# Patient Record
Sex: Male | Born: 1980 | Race: White | Hispanic: No | Marital: Single | Smoking: Never smoker
Health system: Southern US, Community
[De-identification: ages and names within clinical notes are randomized; demographics above are authoritative.]

## PROBLEM LIST (undated history)

## (undated) DIAGNOSIS — J45909 Unspecified asthma, uncomplicated: Secondary | ICD-10-CM

## (undated) DIAGNOSIS — I10 Essential (primary) hypertension: Secondary | ICD-10-CM

## (undated) DIAGNOSIS — E119 Type 2 diabetes mellitus without complications: Secondary | ICD-10-CM

## (undated) DIAGNOSIS — N400 Enlarged prostate without lower urinary tract symptoms: Secondary | ICD-10-CM

## (undated) DIAGNOSIS — K4020 Bilateral inguinal hernia, without obstruction or gangrene, not specified as recurrent: Secondary | ICD-10-CM

## (undated) DIAGNOSIS — B20 Human immunodeficiency virus [HIV] disease: Secondary | ICD-10-CM

## (undated) DIAGNOSIS — E669 Obesity, unspecified: Secondary | ICD-10-CM

## (undated) HISTORY — PX: OTHER SURGICAL HISTORY: SHX169

## (undated) HISTORY — PX: HERNIA REPAIR: SHX51

## (undated) HISTORY — DX: Obesity, unspecified: E66.9

## (undated) HISTORY — DX: Type 2 diabetes mellitus without complications: E11.9

## (undated) HISTORY — DX: Benign prostatic hyperplasia without lower urinary tract symptoms: N40.0

## (undated) HISTORY — DX: Human immunodeficiency virus (HIV) disease: B20

---

## 2010-04-06 ENCOUNTER — Emergency Department (HOSPITAL_COMMUNITY): Admission: EM | Admit: 2010-04-06 | Discharge: 2010-04-06 | Payer: Self-pay | Admitting: Emergency Medicine

## 2010-07-29 ENCOUNTER — Emergency Department (HOSPITAL_COMMUNITY): Admission: EM | Admit: 2010-07-29 | Discharge: 2010-01-11 | Payer: Self-pay | Admitting: Emergency Medicine

## 2010-11-05 LAB — RAPID STREP SCREEN (MED CTR MEBANE ONLY): Streptococcus, Group A Screen (Direct): NEGATIVE

## 2010-11-08 LAB — DIFFERENTIAL
Basophils Absolute: 0 10*3/uL (ref 0.0–0.1)
Basophils Relative: 0 % (ref 0–1)
Eosinophils Absolute: 0.1 10*3/uL (ref 0.0–0.7)
Eosinophils Relative: 1 % (ref 0–5)
Lymphocytes Relative: 25 % (ref 12–46)
Lymphs Abs: 1.3 10*3/uL (ref 0.7–4.0)
Monocytes Absolute: 0.5 10*3/uL (ref 0.1–1.0)
Monocytes Relative: 10 % (ref 3–12)
Neutro Abs: 3.4 10*3/uL (ref 1.7–7.7)
Neutrophils Relative %: 64 % (ref 43–77)

## 2010-11-08 LAB — CBC
HCT: 42 % (ref 39.0–52.0)
Hemoglobin: 14.8 g/dL (ref 13.0–17.0)
MCHC: 35.2 g/dL (ref 30.0–36.0)
MCV: 84.6 fL (ref 78.0–100.0)
Platelets: 245 10*3/uL (ref 150–400)
RBC: 4.97 MIL/uL (ref 4.22–5.81)
RDW: 12.2 % (ref 11.5–15.5)
WBC: 5.4 10*3/uL (ref 4.0–10.5)

## 2010-11-08 LAB — COMPREHENSIVE METABOLIC PANEL
ALT: 94 U/L — ABNORMAL HIGH (ref 0–53)
AST: 41 U/L — ABNORMAL HIGH (ref 0–37)
Albumin: 4 g/dL (ref 3.5–5.2)
Alkaline Phosphatase: 54 U/L (ref 39–117)
BUN: 14 mg/dL (ref 6–23)
CO2: 27 mEq/L (ref 19–32)
Calcium: 9 mg/dL (ref 8.4–10.5)
Chloride: 105 mEq/L (ref 96–112)
Creatinine, Ser: 0.93 mg/dL (ref 0.4–1.5)
GFR calc Af Amer: >60 mL/min (ref >60)
GFR calc non Af Amer: >60 mL/min (ref >60)
Glucose, Bld: 111 mg/dL — ABNORMAL HIGH (ref 70–99)
Potassium: 3.7 mEq/L (ref 3.5–5.1)
Sodium: 139 mEq/L (ref 135–145)
Total Bilirubin: 0.6 mg/dL (ref 0.3–1.2)
Total Protein: 6.7 g/dL (ref 6.0–8.3)

## 2010-11-08 LAB — URINALYSIS, ROUTINE W REFLEX MICROSCOPIC
Bilirubin Urine: NEGATIVE
Glucose, UA: NEGATIVE mg/dL
Hgb urine dipstick: NEGATIVE
Ketones, ur: NEGATIVE mg/dL
Nitrite: NEGATIVE
Protein, ur: NEGATIVE mg/dL
Specific Gravity, Urine: 1.01 (ref 1.005–1.030)
Urobilinogen, UA: 0.2 mg/dL (ref 0.0–1.0)
pH: 6 (ref 5.0–8.0)

## 2010-11-08 LAB — LIPASE, BLOOD: Lipase: 20 U/L (ref 11–59)

## 2011-12-09 ENCOUNTER — Other Ambulatory Visit: Payer: Self-pay | Admitting: Physician Assistant

## 2011-12-09 ENCOUNTER — Ambulatory Visit (HOSPITAL_COMMUNITY)
Admission: RE | Admit: 2011-12-09 | Discharge: 2011-12-09 | Disposition: A | Discharge status: Home / Self Care | Payer: 59 | Source: Ambulatory Visit | Attending: Physician Assistant | Admitting: Physician Assistant

## 2011-12-09 DIAGNOSIS — N400 Enlarged prostate without lower urinary tract symptoms: Secondary | ICD-10-CM | POA: Insufficient documentation

## 2011-12-09 DIAGNOSIS — R1012 Left upper quadrant pain: Secondary | ICD-10-CM

## 2012-01-09 ENCOUNTER — Ambulatory Visit (INDEPENDENT_AMBULATORY_CARE_PROVIDER_SITE_OTHER): Payer: Commercial Managed Care - PPO | Admitting: General Surgery

## 2012-01-09 ENCOUNTER — Encounter (INDEPENDENT_AMBULATORY_CARE_PROVIDER_SITE_OTHER): Payer: Self-pay | Admitting: General Surgery

## 2012-01-09 VITALS — BP 110/80 | HR 78 | Temp 97.9°F | Resp 14 | Ht 68.0 in | Wt 175.4 lb

## 2012-01-09 DIAGNOSIS — K4020 Bilateral inguinal hernia, without obstruction or gangrene, not specified as recurrent: Secondary | ICD-10-CM

## 2012-01-09 NOTE — Progress Notes (Signed)
Patient ID: Mark Maddox, male   DOB: 1980/10/16, 31 y.o.   MRN: [id number]  Chief Complaint  Patient presents with  . New Evaluation    New Pt. Eval [facility]    HPI Mark Maddox is a 31 y.o. male.  Referred by Helene Kelp and Dr. Christell Constant for evaluation of left inguinal hernia.  He says that he works as a Psychologist, sport and exercise at [employer] and felt a "rip" in his groin back in December after lifting a large patient. He subsequently had a coughing spell which caused some discomfort in the area and he has noticed a bulge in both groins which he says has been increasing in size. He says that they reduce spontaneously when lying flat but doesn't complain of some mid abdominal discomfort which occurs about once per year and last for one week at time as well as some occasional bloating. Otherwise he denies any obstructive symptoms.  HPI  History reviewed. No pertinent past medical history.  History reviewed. No pertinent past surgical history.  Family History  Problem Relation Age of Onset  . Diabetes Maternal Grandmother   . Heart disease Maternal Grandfather     heart attack  . Diabetes Maternal Grandfather     Social History History  Substance Use Topics  . Smoking status: Never Smoker   . Smokeless tobacco: Not on file  . Alcohol Use: No    Not on File  No current outpatient prescriptions on file.   No current outpatient prescriptions on file prior to visit.     Review of Systems Review of Systems All other review of systems negative or noncontributory except as stated in the HPI  Blood pressure 110/80, pulse 78, temperature 97.9 F (36.6 C), temperature source Temporal, resp. rate 14, height 5\' 8"  (1.727 m), weight 175 lb 6.4 oz (79.561 kg).  Physical Exam Physical Exam Physical Exam  Vitals reviewed. Constitutional: He is oriented to person, place, and time. He appears well-developed and well-nourished. No distress.  HENT:  Head: Normocephalic and atraumatic.  Mouth/Throat: No  oropharyngeal exudate.  Eyes: Conjunctivae and EOM are normal. Pupils are equal, round, and reactive to light. Right eye exhibits no discharge. Left eye exhibits no discharge. No scleral icterus.  Neck: Normal range of motion. No tracheal deviation present.  Cardiovascular: Normal rate, regular rhythm and normal heart sounds.   Pulmonary/Chest: Effort normal and breath sounds normal. No stridor. No respiratory distress. He has no wheezes. He has no rales. He exhibits no tenderness.  Abdominal: Soft. Bowel sounds are normal. He exhibits no distension and no mass. There is no tenderness. There is no rebound and no guarding. Moderate sized bilateral inguinal bulges which are easily reducible. Musculoskeletal: Normal range of motion. He exhibits no edema and no tenderness.  Neurological: He is alert and oriented to person, place, and time.  Skin: Skin is warm and dry. No rash noted. He is not diaphoretic. No erythema. No pallor.  Psychiatric: He has a normal mood and affect. His behavior is normal. Judgment and thought content normal.    Data Reviewed CT  Assessment    Bilateral inguinal hernias He has mildly symptomatic bilateral inguinal hernias on exam and by history.  I discussed with him the options for continued observation versus surgical repair and both laparoscopic and open repairs. We discussed the pros and cons of each and the risks and benefits and he would like to proceed with laparoscopic bilateral inguinal hernia repair. However, he would like to hold off  on repair until September so we will have him follow up against in July so we can set up his procedure.    Plan    Laparoscopic bilateral inguinal hernia repair with mesh when desired by the patient.       Lodema Pilot DAVID 01/09/2012, 12:19 PM

## 2012-05-17 ENCOUNTER — Other Ambulatory Visit (INDEPENDENT_AMBULATORY_CARE_PROVIDER_SITE_OTHER): Payer: Self-pay | Admitting: General Surgery

## 2012-05-17 DIAGNOSIS — K4020 Bilateral inguinal hernia, without obstruction or gangrene, not specified as recurrent: Secondary | ICD-10-CM

## 2012-06-01 ENCOUNTER — Other Ambulatory Visit (INDEPENDENT_AMBULATORY_CARE_PROVIDER_SITE_OTHER): Payer: Self-pay | Admitting: General Surgery

## 2012-06-05 ENCOUNTER — Ambulatory Visit (INDEPENDENT_AMBULATORY_CARE_PROVIDER_SITE_OTHER): Payer: Commercial Managed Care - PPO | Admitting: General Surgery

## 2012-06-05 ENCOUNTER — Encounter (INDEPENDENT_AMBULATORY_CARE_PROVIDER_SITE_OTHER): Payer: Self-pay | Admitting: General Surgery

## 2012-06-05 VITALS — BP 118/78 | HR 71 | Temp 97.8°F | Resp 16 | Ht 68.0 in | Wt 187.4 lb

## 2012-06-05 DIAGNOSIS — K4020 Bilateral inguinal hernia, without obstruction or gangrene, not specified as recurrent: Secondary | ICD-10-CM

## 2012-06-05 MED ORDER — OXYCODONE-ACETAMINOPHEN 5-325 MG PO TABS: 1.0000 | ORAL_TABLET | ORAL | Status: DC | PRN

## 2012-06-05 NOTE — Progress Notes (Signed)
Patient ID: Mark Maddox, male   DOB: 11-26-1980, 31 y.o.   MRN: [id number]  Chief Complaint  Patient presents with  . Pre-op Exam    Bil ING hernias    HPI Mark Maddox is a 31 y.o. male referred by Dr. Rudi Heap for bilateral hernia fixation. Patient is a 31 year old male with a 8 month history of bilateral inguinal hernia. The an employee at [employer] and noticed a after lifting a heavy patient tenderness and burning sensation is bilingual inguinal areas. The patient as he does pull this bilaterally since this time have increased in size and become more painful. The patient initially wanted to wait for into the beginning of next year but secondary to increasing size and pain wishes to proceed with laparoscopic repair.  The patient denies any urinary or bowel obstructive symptoms at this time. 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 HPI  History reviewed. No pertinent past medical history.  History reviewed. No pertinent past surgical history.  Family History  Problem Relation Age of Onset  . Diabetes Maternal Grandmother   . Heart disease Maternal Grandfather     heart attack  . Diabetes Maternal Grandfather     Social History History  Substance Use Topics  . Smoking status: Never Smoker   . Smokeless tobacco: Not on file  . Alcohol Use: No    No Known Allergies  No current outpatient prescriptions on file.    Review of Systems Review of Systems  Constitutional: Negative.   HENT: Negative.   Eyes: Negative.   Respiratory: Negative.   Cardiovascular: Negative.   Gastrointestinal: Negative.   Musculoskeletal: Negative.   Neurological: Negative.     Blood pressure 118/78, pulse 71, temperature 97.8 F (36.6 C), temperature source Temporal, resp. rate 16, height 5\' 8"  (1.727 m), weight 187 lb 6.4 oz (85.004 kg).  Physical Exam Physical Exam  Constitutional: He is oriented to person, place, and time. He appears  well-developed and well-nourished.  HENT:  Head: Normocephalic and atraumatic.  Eyes: Conjunctivae normal are normal. Pupils are equal, round, and reactive to light.  Neck: Normal range of motion. Neck supple.  Cardiovascular: Normal rate, regular rhythm and normal heart sounds.   Pulmonary/Chest: Effort normal and breath sounds normal.  Abdominal: Soft. Bowel sounds are normal. A hernia is present. Hernia confirmed positive in the right inguinal area and confirmed positive in the left inguinal area.  Musculoskeletal: Normal range of motion.  Neurological: He is alert and oriented to person, place, and time.     Assessment    31 year old man with bilateral in her hernias are reducible.    Plan    1. Proceed operative for a laparoscopic bilateral inguinal hernia repair with Mesh.  2. All risks and benefits were discussed with the patient, to generally include infection, bleeding, damage to surrounding structures, and recurrence. Alternatives were offered and described.  All questions were answered and the patient voiced understanding of the procedure and wishes to proceed at this point.        Marigene Ehlers., Mikeria Valin 06/05/2012, 3:02 PM

## 2012-06-26 ENCOUNTER — Encounter (HOSPITAL_COMMUNITY)
Admission: RE | Admit: 2012-06-26 | Discharge: 2012-06-26 | Disposition: A | Discharge status: Home / Self Care | Payer: 59 | Source: Ambulatory Visit | Attending: Anesthesiology | Admitting: Anesthesiology

## 2012-06-26 ENCOUNTER — Encounter (HOSPITAL_COMMUNITY)
Admission: RE | Admit: 2012-06-26 | Discharge: 2012-06-26 | Disposition: A | Discharge status: Home / Self Care | Payer: 59 | Source: Ambulatory Visit | Attending: General Surgery | Admitting: General Surgery

## 2012-06-26 ENCOUNTER — Encounter (HOSPITAL_COMMUNITY): Payer: Self-pay

## 2012-06-26 HISTORY — DX: Unspecified asthma, uncomplicated: J45.909

## 2012-06-26 HISTORY — DX: Essential (primary) hypertension: I10

## 2012-06-26 HISTORY — DX: Bilateral inguinal hernia, without obstruction or gangrene, not specified as recurrent: K40.20

## 2012-06-26 LAB — BASIC METABOLIC PANEL
BUN: 11 mg/dL (ref 6–23)
CO2: 29 mEq/L (ref 19–32)
Calcium: 9.8 mg/dL (ref 8.4–10.5)
Chloride: 100 mEq/L (ref 96–112)
Creatinine, Ser: 0.88 mg/dL (ref 0.50–1.35)
GFR calc Af Amer: >90 mL/min (ref >90)
GFR calc non Af Amer: >90 mL/min (ref >90)
Glucose, Bld: 121 mg/dL — ABNORMAL HIGH (ref 70–99)
Potassium: 3.7 mEq/L (ref 3.5–5.1)
Sodium: 138 mEq/L (ref 135–145)

## 2012-06-26 LAB — SURGICAL PCR SCREEN
MRSA, PCR: NEGATIVE
Staphylococcus aureus: POSITIVE — AB

## 2012-06-26 LAB — CBC
HCT: 47.2 % (ref 39.0–52.0)
Hemoglobin: 16.3 g/dL (ref 13.0–17.0)
MCH: 29.2 pg (ref 26.0–34.0)
MCHC: 34.5 g/dL (ref 30.0–36.0)
MCV: 84.6 fL (ref 78.0–100.0)
Platelets: 223 10*3/uL (ref 150–400)
RBC: 5.58 MIL/uL (ref 4.22–5.81)
RDW: 12.3 % (ref 11.5–15.5)
WBC: 6.4 10*3/uL (ref 4.0–10.5)

## 2012-06-26 NOTE — Pre-Procedure Instructions (Signed)
20 DEMICHAEL TRAUM  06/26/2012   Your procedure is scheduled on:  Wednesday July 04, 2012  Report to [facility] at 7:30 AM.  Call this number if you have problems the morning of surgery: [phone]   Remember:   Do not eat food or drink :After Midnight.      Take these medicines the morning of surgery with A SIP OF WATER: none   Do not wear jewelry, make-up or nail polish.  Do not wear lotions, powders, or perfumes.   Do not shave 48 hours prior to surgery. Men may shave face and neck.  Do not bring valuables to the hospital.  Contacts, dentures or bridgework may not be worn into surgery.  Leave suitcase in the car. After surgery it may be brought to your room.  For patients admitted to the hospital, checkout time is 11:00 AM the day of discharge.   Patients discharged the day of surgery will not be allowed to drive home.  Name and phone number of your driver: family / friend  Special Instructions: Shower using CHG 2 nights before surgery and the night before surgery.  If you shower the day of surgery use CHG.  Use special wash - you have one bottle of CHG for all showers.  You should use approximately 1/3 of the bottle for each shower.   Please read over the following fact sheets that you were given: Pain Booklet, Coughing and Deep Breathing, MRSA Information and Surgical Site Infection Prevention

## 2012-07-03 MED ORDER — CEFAZOLIN SODIUM-DEXTROSE 2-3 GM-% IV SOLR
2.0000 g | INTRAVENOUS | Status: AC
  Administered 2012-07-04: 2 g via INTRAVENOUS
  Filled 2012-07-03: qty 50

## 2012-07-04 ENCOUNTER — Encounter (HOSPITAL_COMMUNITY): Payer: Self-pay | Admitting: *Deleted

## 2012-07-04 ENCOUNTER — Ambulatory Visit (HOSPITAL_COMMUNITY): Payer: 59 | Admitting: Anesthesiology

## 2012-07-04 ENCOUNTER — Encounter (HOSPITAL_COMMUNITY): Payer: Self-pay | Admitting: Anesthesiology

## 2012-07-04 ENCOUNTER — Ambulatory Visit (HOSPITAL_COMMUNITY)
Admission: RE | Admit: 2012-07-04 | Discharge: 2012-07-04 | Disposition: A | Discharge status: Home / Self Care | Payer: 59 | Source: Ambulatory Visit | Attending: General Surgery | Admitting: General Surgery

## 2012-07-04 ENCOUNTER — Encounter (HOSPITAL_COMMUNITY)
Admission: RE | Disposition: A | Discharge status: Home / Self Care | Payer: Self-pay | Source: Ambulatory Visit | Attending: General Surgery

## 2012-07-04 DIAGNOSIS — K4020 Bilateral inguinal hernia, without obstruction or gangrene, not specified as recurrent: Secondary | ICD-10-CM | POA: Insufficient documentation

## 2012-07-04 DIAGNOSIS — Z8719 Personal history of other diseases of the digestive system: Secondary | ICD-10-CM

## 2012-07-04 DIAGNOSIS — Z01818 Encounter for other preprocedural examination: Secondary | ICD-10-CM | POA: Insufficient documentation

## 2012-07-04 DIAGNOSIS — I10 Essential (primary) hypertension: Secondary | ICD-10-CM | POA: Insufficient documentation

## 2012-07-04 DIAGNOSIS — Z01812 Encounter for preprocedural laboratory examination: Secondary | ICD-10-CM | POA: Insufficient documentation

## 2012-07-04 DIAGNOSIS — Z9889 Other specified postprocedural states: Secondary | ICD-10-CM

## 2012-07-04 DIAGNOSIS — Z01810 Encounter for preprocedural cardiovascular examination: Secondary | ICD-10-CM | POA: Insufficient documentation

## 2012-07-04 HISTORY — PX: INSERTION OF MESH: SHX5868

## 2012-07-04 HISTORY — PX: INGUINAL HERNIA REPAIR: SHX194

## 2012-07-04 SURGERY — REPAIR, HERNIA, INGUINAL, BILATERAL, LAPAROSCOPIC
Anesthesia: General | Site: Abdomen | Laterality: Bilateral | Wound class: Clean

## 2012-07-04 MED ORDER — OXYCODONE HCL 5 MG/5ML PO SOLN: 5.0000 mg | Freq: Once | ORAL | Status: DC | PRN

## 2012-07-04 MED ORDER — DEXAMETHASONE SODIUM PHOSPHATE 4 MG/ML IJ SOLN
INTRAMUSCULAR | Status: DC | PRN
  Administered 2012-07-04: 4 mg via INTRAVENOUS

## 2012-07-04 MED ORDER — MIDAZOLAM HCL 2 MG/2ML IJ SOLN
INTRAMUSCULAR | Status: AC
  Filled 2012-07-04: qty 2

## 2012-07-04 MED ORDER — FENTANYL CITRATE 0.05 MG/ML IJ SOLN
INTRAMUSCULAR | Status: DC | PRN
  Administered 2012-07-04: 100 ug via INTRAVENOUS
  Administered 2012-07-04 (×2): 50 ug via INTRAVENOUS

## 2012-07-04 MED ORDER — HYDROMORPHONE HCL PF 1 MG/ML IJ SOLN
0.2500 mg | INTRAMUSCULAR | Status: DC | PRN
  Administered 2012-07-04 (×3): 0.5 mg via INTRAVENOUS

## 2012-07-04 MED ORDER — MIDAZOLAM HCL 2 MG/2ML IJ SOLN
1.0000 mg | INTRAMUSCULAR | Status: DC | PRN
  Administered 2012-07-04: 2 mg via INTRAVENOUS

## 2012-07-04 MED ORDER — GLYCOPYRROLATE 0.2 MG/ML IJ SOLN
INTRAMUSCULAR | Status: DC | PRN
  Administered 2012-07-04: .5 mg via INTRAVENOUS

## 2012-07-04 MED ORDER — HYDROMORPHONE HCL PF 1 MG/ML IJ SOLN
INTRAMUSCULAR | Status: AC
  Filled 2012-07-04: qty 1

## 2012-07-04 MED ORDER — PROMETHAZINE HCL 25 MG/ML IJ SOLN: 6.2500 mg | INTRAMUSCULAR | Status: DC | PRN

## 2012-07-04 MED ORDER — ARTIFICIAL TEARS OP OINT
TOPICAL_OINTMENT | OPHTHALMIC | Status: DC | PRN
  Administered 2012-07-04: 1 via OPHTHALMIC

## 2012-07-04 MED ORDER — 0.9 % SODIUM CHLORIDE (POUR BTL) OPTIME
TOPICAL | Status: DC | PRN
  Administered 2012-07-04: 1000 mL

## 2012-07-04 MED ORDER — LACTATED RINGERS IV SOLN
INTRAVENOUS | Status: DC | PRN
  Administered 2012-07-04 (×2): via INTRAVENOUS

## 2012-07-04 MED ORDER — MEPERIDINE HCL 25 MG/ML IJ SOLN: 6.2500 mg | INTRAMUSCULAR | Status: DC | PRN

## 2012-07-04 MED ORDER — OXYCODONE HCL 5 MG PO TABS: 5.0000 mg | ORAL_TABLET | Freq: Once | ORAL | Status: DC | PRN

## 2012-07-04 MED ORDER — LACTATED RINGERS IV SOLN: INTRAVENOUS | Status: DC

## 2012-07-04 MED ORDER — NEOSTIGMINE METHYLSULFATE 1 MG/ML IJ SOLN
INTRAMUSCULAR | Status: DC | PRN
  Administered 2012-07-04: 4 mg via INTRAVENOUS

## 2012-07-04 MED ORDER — DEXTROSE 5 % IV SOLN
INTRAVENOUS | Status: DC | PRN
  Administered 2012-07-04: 10:00:00 via INTRAVENOUS

## 2012-07-04 MED ORDER — ROCURONIUM BROMIDE 100 MG/10ML IV SOLN
INTRAVENOUS | Status: DC | PRN
  Administered 2012-07-04: 50 mg via INTRAVENOUS
  Administered 2012-07-04: 20 mg via INTRAVENOUS

## 2012-07-04 MED ORDER — HYDROMORPHONE HCL PF 1 MG/ML IJ SOLN: 0.2500 mg | INTRAMUSCULAR | Status: DC | PRN

## 2012-07-04 MED ORDER — LIDOCAINE HCL (CARDIAC) 20 MG/ML IV SOLN
INTRAVENOUS | Status: DC | PRN
  Administered 2012-07-04: 100 mg via INTRAVENOUS

## 2012-07-04 MED ORDER — BUPIVACAINE HCL (PF) 0.25 % IJ SOLN
INTRAMUSCULAR | Status: AC
  Filled 2012-07-04: qty 30

## 2012-07-04 MED ORDER — ONDANSETRON HCL 4 MG/2ML IJ SOLN
INTRAMUSCULAR | Status: DC | PRN
  Administered 2012-07-04: 4 mg via INTRAVENOUS

## 2012-07-04 MED ORDER — PROPOFOL 10 MG/ML IV BOLUS
INTRAVENOUS | Status: DC | PRN
  Administered 2012-07-04: 50 mg via INTRAVENOUS
  Administered 2012-07-04: 200 mg via INTRAVENOUS

## 2012-07-04 MED ORDER — BUPIVACAINE HCL 0.25 % IJ SOLN
INTRAMUSCULAR | Status: DC | PRN
  Administered 2012-07-04: 8 mL

## 2012-07-04 MED ORDER — OXYCODONE-ACETAMINOPHEN 5-325 MG PO TABS: 1.0000 | ORAL_TABLET | ORAL | Status: DC | PRN

## 2012-07-04 SURGICAL SUPPLY — 48 items
APPLIER CLIP LOGIC TI 5 (MISCELLANEOUS) IMPLANT
BENZOIN TINCTURE PRP APPL 2/3 (GAUZE/BANDAGES/DRESSINGS) ×2 IMPLANT
CANISTER SUCTION 2500CC (MISCELLANEOUS) IMPLANT
CHLORAPREP W/TINT 26ML (MISCELLANEOUS) ×2 IMPLANT
CLOTH BEACON ORANGE TIMEOUT ST (SAFETY) ×2 IMPLANT
COVER SURGICAL LIGHT HANDLE (MISCELLANEOUS) ×2 IMPLANT
DEVICE TROCAR PUNCTURE CLOSURE (ENDOMECHANICALS) ×2 IMPLANT
DISSECTOR BLUNT TIP ENDO 5MM (MISCELLANEOUS) IMPLANT
DRAPE UTILITY 15X26 W/TAPE STR (DRAPE) ×4 IMPLANT
ELECT REM PT RETURN 9FT ADLT (ELECTROSURGICAL) ×2
ELECTRODE REM PT RTRN 9FT ADLT (ELECTROSURGICAL) ×1 IMPLANT
GAUZE SPONGE 2X2 8PLY STRL LF (GAUZE/BANDAGES/DRESSINGS) ×1 IMPLANT
GLOVE BIO SURGEON STRL SZ7 (GLOVE) ×2 IMPLANT
GLOVE BIO SURGEON STRL SZ7.5 (GLOVE) ×4 IMPLANT
GLOVE BIOGEL PI IND STRL 6.5 (GLOVE) ×1 IMPLANT
GLOVE BIOGEL PI IND STRL 7.0 (GLOVE) ×4 IMPLANT
GLOVE BIOGEL PI IND STRL 7.5 (GLOVE) ×1 IMPLANT
GLOVE BIOGEL PI INDICATOR 6.5 (GLOVE) ×1
GLOVE BIOGEL PI INDICATOR 7.0 (GLOVE) ×4
GLOVE BIOGEL PI INDICATOR 7.5 (GLOVE) ×1
GLOVE SS BIOGEL STRL SZ 6.5 (GLOVE) ×1 IMPLANT
GLOVE SUPERSENSE BIOGEL SZ 6.5 (GLOVE) ×1
GLOVE SURG SS PI 7.0 STRL IVOR (GLOVE) ×2 IMPLANT
GOWN STRL NON-REIN LRG LVL3 (GOWN DISPOSABLE) ×4 IMPLANT
GOWN STRL REIN XL XLG (GOWN DISPOSABLE) ×2 IMPLANT
KIT BASIN OR (CUSTOM PROCEDURE TRAY) ×2 IMPLANT
KIT ROOM TURNOVER OR (KITS) ×2 IMPLANT
MESH ULTRAPRO 6X6 15CM15CM (Mesh General) ×4 IMPLANT
NEEDLE INSUFFLATION 14GA 120MM (NEEDLE) ×2 IMPLANT
NS IRRIG 1000ML POUR BTL (IV SOLUTION) ×2 IMPLANT
PAD ARMBOARD 7.5X6 YLW CONV (MISCELLANEOUS) ×4 IMPLANT
RELOAD STAPLE HERNIA 4.0 BLUE (INSTRUMENTS) ×2 IMPLANT
RELOAD STAPLE HERNIA 4.8 BLK (STAPLE) IMPLANT
SCISSORS LAP 5X35 DISP (ENDOMECHANICALS) ×2 IMPLANT
SET IRRIG TUBING LAPAROSCOPIC (IRRIGATION / IRRIGATOR) IMPLANT
SLEEVE ENDOPATH XCEL 5M (ENDOMECHANICALS) ×2 IMPLANT
SPONGE GAUZE 2X2 STER 10/PKG (GAUZE/BANDAGES/DRESSINGS) ×1
STAPLER HERNIA 12 8.5 360D (INSTRUMENTS) ×2 IMPLANT
SUT MNCRL AB 4-0 PS2 18 (SUTURE) ×2 IMPLANT
SUT VIC AB 1 CT1 27 (SUTURE) ×1
SUT VIC AB 1 CT1 27XBRD ANBCTR (SUTURE) ×1 IMPLANT
TOWEL OR 17X24 6PK STRL BLUE (TOWEL DISPOSABLE) ×2 IMPLANT
TOWEL OR 17X26 10 PK STRL BLUE (TOWEL DISPOSABLE) ×2 IMPLANT
TRAY FOLEY CATH 14FR (SET/KITS/TRAYS/PACK) ×2 IMPLANT
TRAY LAPAROSCOPIC (CUSTOM PROCEDURE TRAY) ×2 IMPLANT
TROCAR XCEL 12X100 BLDLESS (ENDOMECHANICALS) ×2 IMPLANT
TROCAR XCEL NON-BLD 11X100MML (ENDOMECHANICALS) ×2 IMPLANT
TROCAR XCEL NON-BLD 5MMX100MML (ENDOMECHANICALS) ×2 IMPLANT

## 2012-07-04 NOTE — Anesthesia Preprocedure Evaluation (Addendum)
Anesthesia Evaluation  Patient identified by MRN, date of birth, ID band Patient awake    Reviewed: Allergy & Precautions, H&P , NPO status , Patient's Chart, lab work & pertinent test results  Airway  TM Distance: >3 FB     Dental  (+) Teeth Intact and Dental Advisory Given   Pulmonary   Findings: Lungs clear.  Heart size and pulmonary vascularity are normal.  No adenopathy.  There is slight upper thoracic levoscoliosis.   IMPRESSION: Lungs clear.  Childhood asthma/ parents were smokers.  No probs. now  breath sounds clear to auscultation  Pulmonary exam normal       Cardiovascular Exercise Tolerance: Good hypertension, Rhythm:Regular Rate:Normal  26-Jun-2012 09:54:00 [facility] ROUTINE RECORD Normal sinus rhythm Normal ECG No old tracing to compare   Neuro/Psych negative neurological ROS  negative psych ROS   GI/Hepatic negative GI ROS, Neg liver ROS,   Endo/Other  negative endocrine ROS  Renal/GU negative Renal ROS     Musculoskeletal negative musculoskeletal ROS (+)   Abdominal   Peds  Hematology negative hematology ROS (+)   Anesthesia Other Findings   Reproductive/Obstetrics                       Anesthesia Physical Anesthesia Plan  ASA: I  Anesthesia Plan: General   Post-op Pain Management:    Induction: Intravenous  Airway Management Planned: Oral ETT  Additional Equipment:   Intra-op Plan:   Post-operative Plan: Extubation in OR  Informed Consent: I have reviewed the patients History and Physical, chart, labs and discussed the procedure including the risks, benefits and alternatives for the proposed anesthesia with the patient or authorized representative who has indicated his/her understanding and acceptance.   Dental advisory given  Plan Discussed with: CRNA  Anesthesia Plan Comments:         Anesthesia Quick Evaluation

## 2012-07-04 NOTE — Interval H&P Note (Signed)
History and Physical Interval Note:  07/04/2012 9:15 AM  Rayburn Felt  has presented today for surgery, with the diagnosis of BILATERAL INGUINAL HERNIAS  The various methods of treatment have been discussed with the patient and family. After consideration of risks, benefits and other options for treatment, the patient has consented to  Procedure(s) (LRB) with comments: LAPAROSCOPIC BILATERAL INGUINAL HERNIA REPAIR (Bilateral) INSERTION OF MESH (Bilateral) as a surgical intervention .  The patient's history has been reviewed, patient examined, no change in status, stable for surgery.  I have reviewed the patient's chart and labs.  Questions were answered to the patient's satisfaction.     Marigene Ehlers., Jed Limerick

## 2012-07-04 NOTE — Op Note (Signed)
Pre Operative Diagnosis: bilateral inguinal hernias  Post Operative Diagnosis: same  Procedure: laparoscopic bilateral inguinal hernia repair with mesh  Surgeon: Dr. Axel Filler  Assistant: none  Anesthesia: GETA  EBL: 10 cc  Complications: none  Counts: reported as correct x 2  Findings:  The patient had a bilateral direct hernias  Indications for procedure:  The patient is a 31 year old male with bilateral direct hernias. Patient stated the pain became more severe over the last several months and decided to have this electively repaired.  Details of the procedure:The patient was taken back to the operating room. The patient was placed in supine position with bilateral SCDs in place. After appropriate anitbiotics were confirmed, a time-out was confirmed and all facts were verified.  0.25% Marcaine was used to infiltrate the umbilical area. He was used to cut down the skin and blunt dissection was used to get the anterior fashion.  The anterior fascia was incised approximately 1 cm and the muscles were divided anteriorly. Blunt dissection was then used to create a space in the preperitoneal area. At this time a 10 mm camera was then introduced into the space and advanced the pubic tubercle and a 12 mm trocar was placed over this and insufflation was started.  At this time and space was created from medial to laterally the preperitoneal space. The hernia sac was identified. Dissection of the hernia sac was undertaken the vas deferens was identified and protected in all parts of the case.   A Veress needle right upper quadrant to help evacuate the intraperitoneal air for a likely peritoneal tear.  Once the hernia sac was taken down to approximately the umbilicus. A 6 x 6" Prolite mesh was cut into shape and introduced into the preperitoneal space.  The mesh was brought over the hernia defect and anchored into place and secured to Cooper's ligament with 4.0 there was a hernia stapler staples.  It was anchored to the anterior abdominal wall with 4.8 mm staples. The lipoma was seen over the mesh. There was no staples placed laterally.   The exact same procedure took place on the left side.  The insufflation was evacuated. The trochars were removed. The anterior fascia was reapproximated using #1 Vicryl on a UR- 6.  Intra-abdominal air was evacuated the Veress needle. The skin was reapproximated using 4-0 Monocryl subcuticular fashion the patient was awakened from general anesthesia and taken to recovery in stable condition.

## 2012-07-04 NOTE — Anesthesia Procedure Notes (Signed)
Procedure Name: Intubation Date/Time: 07/04/2012 9:34 AM Performed by: Tyrone Nine Pre-anesthesia Checklist: Emergency Drugs available, Suction available, Patient being monitored, Patient identified and Timeout performed Patient Re-evaluated:Patient Re-evaluated prior to inductionOxygen Delivery Method: Circle system utilized Preoxygenation: Pre-oxygenation with 100% oxygen Intubation Type: IV induction Ventilation: Mask ventilation without difficulty Laryngoscope Size: Mac and 3 Grade View: Grade I Tube type: Oral Tube size: 7.5 mm Number of attempts: 1 Airway Equipment and Method: Stylet Placement Confirmation: breath sounds checked- equal and bilateral,  CO2 detector,  positive ETCO2 and ETT inserted through vocal cords under direct vision Secured at: 0 cm Tube secured with: Tape Dental Injury: Teeth and Oropharynx as per pre-operative assessment

## 2012-07-04 NOTE — Anesthesia Postprocedure Evaluation (Signed)
Anesthesia Post Note  Patient: Mark Maddox  Procedure(s) Performed: Procedure(s) (LRB): LAPAROSCOPIC BILATERAL INGUINAL HERNIA REPAIR (Bilateral) INSERTION OF MESH (Bilateral)  Anesthesia type: General  Patient location: PACU  Post pain: Pain level controlled and Adequate analgesia  Post assessment: Post-op Vital signs reviewed, Patient's Cardiovascular Status Stable, Respiratory Function Stable, Patent Airway and Pain level controlled  Last Vitals:  Filed Vitals:   07/04/12 1215  BP:   Pulse:   Temp: 36.7 C  Resp:     Post vital signs: Reviewed and stable  Level of consciousness: awake, alert  and oriented  Complications: No apparent anesthesia complications

## 2012-07-04 NOTE — Transfer of Care (Signed)
Immediate Anesthesia Transfer of Care Note  Patient: Mark Maddox  Procedure(s) Performed: Procedure(s) (LRB) with comments: LAPAROSCOPIC BILATERAL INGUINAL HERNIA REPAIR (Bilateral) INSERTION OF MESH (Bilateral)  Patient Location: PACU  Anesthesia Type:General  Level of Consciousness: sedated and patient cooperative  Airway & Oxygen Therapy: Patient Spontanous Breathing and Patient connected to face mask oxygen  Post-op Assessment: Report given to PACU RN and Post -op Vital signs reviewed and stable  Post vital signs: Reviewed and stable  Complications: No apparent anesthesia complications

## 2012-07-04 NOTE — H&P (View-Only) (Signed)
Patient ID: Mark Maddox, male   DOB: 10/26/1980, 31 y.o.   MRN: [id number]  Chief Complaint  Patient presents with  . Pre-op Exam    Bil ING hernias    HPI Mark Maddox is a 31 y.o. male referred by Dr. Donald Moore for bilateral hernia fixation. Patient is a 31-year-old male with a 8 month history of bilateral inguinal hernia. The an employee at [employer] and noticed a after lifting a heavy patient tenderness and burning sensation is bilingual inguinal areas. The patient as he does pull this bilaterally since this time have increased in size and become more painful. The patient initially wanted to wait for into the beginning of next year but secondary to increasing size and pain wishes to proceed with laparoscopic repair.  The patient denies any urinary or bowel obstructive symptoms at this time. 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 HPI  History reviewed. No pertinent past medical history.  History reviewed. No pertinent past surgical history.  Family History  Problem Relation Age of Onset  . Diabetes Maternal Grandmother   . Heart disease Maternal Grandfather     heart attack  . Diabetes Maternal Grandfather     Social History History  Substance Use Topics  . Smoking status: Never Smoker   . Smokeless tobacco: Not on file  . Alcohol Use: No    No Known Allergies  No current outpatient prescriptions on file.    Review of Systems Review of Systems  Constitutional: Negative.   HENT: Negative.   Eyes: Negative.   Respiratory: Negative.   Cardiovascular: Negative.   Gastrointestinal: Negative.   Musculoskeletal: Negative.   Neurological: Negative.     Blood pressure 118/78, pulse 71, temperature 97.8 F (36.6 C), temperature source Temporal, resp. rate 16, height 5' 8" (1.727 m), weight 187 lb 6.4 oz (85.004 kg).  Physical Exam Physical Exam  Constitutional: He is oriented to person, place, and time. He appears  well-developed and well-nourished.  HENT:  Head: Normocephalic and atraumatic.  Eyes: Conjunctivae normal are normal. Pupils are equal, round, and reactive to light.  Neck: Normal range of motion. Neck supple.  Cardiovascular: Normal rate, regular rhythm and normal heart sounds.   Pulmonary/Chest: Effort normal and breath sounds normal.  Abdominal: Soft. Bowel sounds are normal. A hernia is present. Hernia confirmed positive in the right inguinal area and confirmed positive in the left inguinal area.  Musculoskeletal: Normal range of motion.  Neurological: He is alert and oriented to person, place, and time.     Assessment    31-year-old man with bilateral in her hernias are reducible.    Plan    1. Proceed operative for a laparoscopic bilateral inguinal hernia repair with Mesh.  2. All risks and benefits were discussed with the patient, to generally include infection, bleeding, damage to surrounding structures, and recurrence. Alternatives were offered and described.  All questions were answered and the patient voiced understanding of the procedure and wishes to proceed at this point.        Mousa Prout Jr., Jeanetta Alonzo 06/05/2012, 3:02 PM    

## 2012-07-04 NOTE — Preoperative (Signed)
Beta Blockers   Reason not to administer Beta Blockers:Not Applicable 

## 2012-07-09 ENCOUNTER — Encounter (HOSPITAL_COMMUNITY): Payer: Self-pay | Admitting: General Surgery

## 2012-07-12 ENCOUNTER — Telehealth (INDEPENDENT_AMBULATORY_CARE_PROVIDER_SITE_OTHER): Payer: Self-pay | Admitting: General Surgery

## 2012-07-12 NOTE — Telephone Encounter (Signed)
Patient called in requesting additional pain medication had bilateral hernia repair w/mesh on 07/04/12. Patient said he has 10 pills left and did not want to run out over the weekend. I advised the patient his request would be sent to Dr. Derrell Lolling and his nurse to advise and he will be contacted if the refill has been approved. Patient agreed and stated he has been having pain because he has been coughing a lot due to his sinuses and he has pinkeye.

## 2012-07-17 ENCOUNTER — Ambulatory Visit (INDEPENDENT_AMBULATORY_CARE_PROVIDER_SITE_OTHER): Payer: Commercial Managed Care - PPO | Admitting: General Surgery

## 2012-07-17 ENCOUNTER — Encounter (INDEPENDENT_AMBULATORY_CARE_PROVIDER_SITE_OTHER): Payer: Self-pay | Admitting: General Surgery

## 2012-07-17 VITALS — BP 112/64 | HR 85 | Temp 97.7°F | Resp 16 | Ht 68.0 in | Wt 189.6 lb

## 2012-07-17 DIAGNOSIS — Z8719 Personal history of other diseases of the digestive system: Secondary | ICD-10-CM

## 2012-07-17 DIAGNOSIS — Z9889 Other specified postprocedural states: Secondary | ICD-10-CM

## 2012-07-17 NOTE — Progress Notes (Signed)
Patient ID: Mark Maddox, male   DOB: [birth year], 31 y.o.   MRN: [id number] The patient is a 31 year old male status post bilateral inguinal hernia repair was done laparoscopically. Patient is to do well postoperatively and as expected. Patient has no pain in his inguinal areas but does have some lower abdominal pain that is consistent with postoperative pain. Patient has otherwise been having normal bowel function.  On exam: Wounds are clean,dry, and intact, there is no palpable hernia on either side.   Assessment plan: We will have the patient followup in one month 2 the patient had a concern he can call further followup.

## 2012-08-09 ENCOUNTER — Encounter (INDEPENDENT_AMBULATORY_CARE_PROVIDER_SITE_OTHER): Payer: Commercial Managed Care - PPO | Admitting: General Surgery

## 2013-05-14 IMAGING — CT CT ABD-PELV W/O CM
2 of 4 series · 17 of 46 positions shown, 19 images · non-contrast
Comparison: 01/11/2010

CLINICAL DATA: Left side abdominal pain for 2 days

CT ABDOMEN AND PELVIS WITHOUT CONTRAST
TECHNIQUE: Multidetector CT imaging of the abdomen and pelvis was
performed following the standard protocol without intravenous
contrast. Oral contrast not administered.  Sagittal and coronal MPR
images reconstructed from axial data set.

[Series 2: abdomen/pelvis w/o contrast · axial · non-contrast · 0.78mm/px · z∈[-505,-85]mm · 14 of 98 slices shown, 16 images]
[im 7/98  soft-tissue]
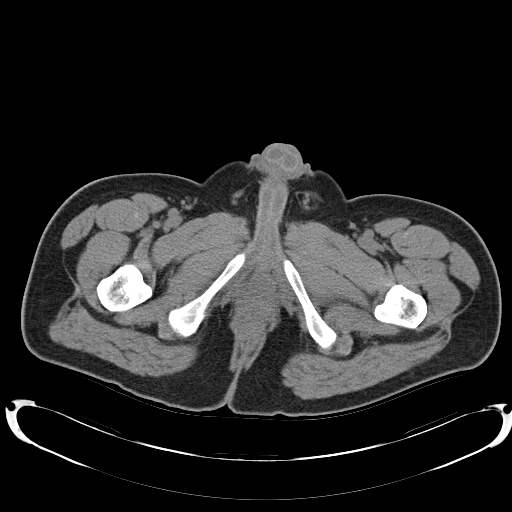
[im 7/98  bone]
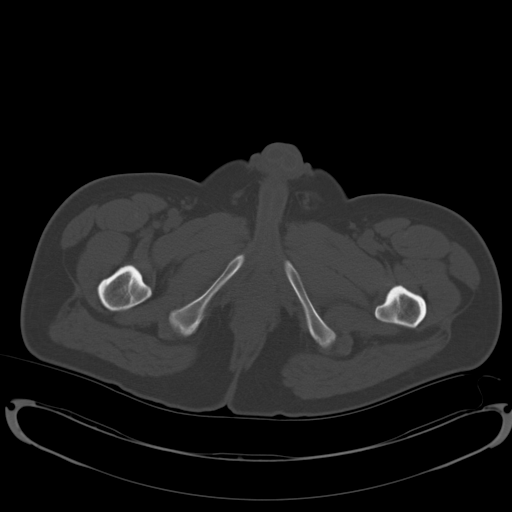
[im 13/98  soft-tissue]
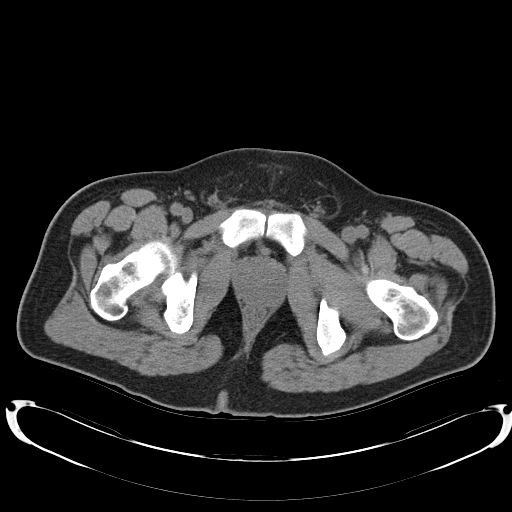
[im 20/98  soft-tissue]
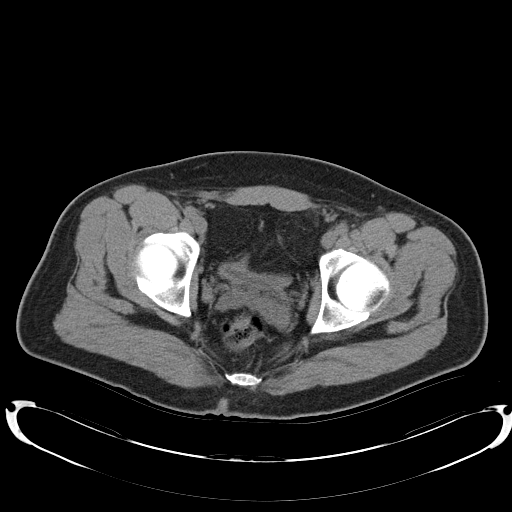
[im 26/98  soft-tissue]
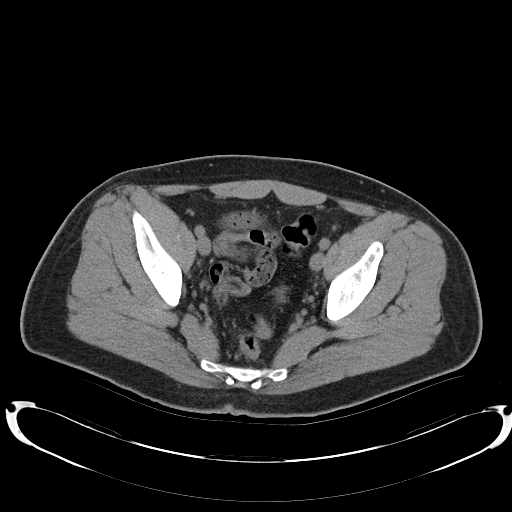
[im 33/98  soft-tissue]
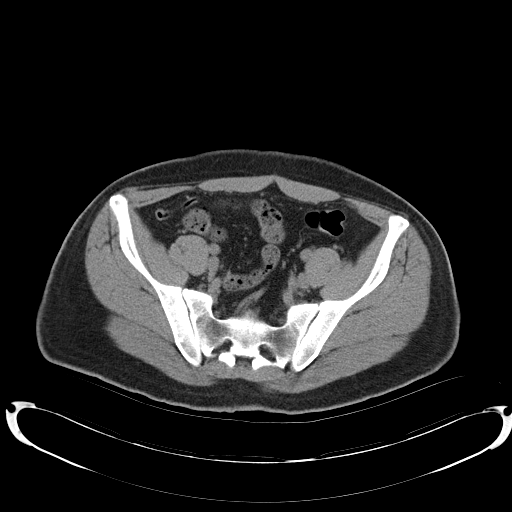
[im 39/98  soft-tissue]
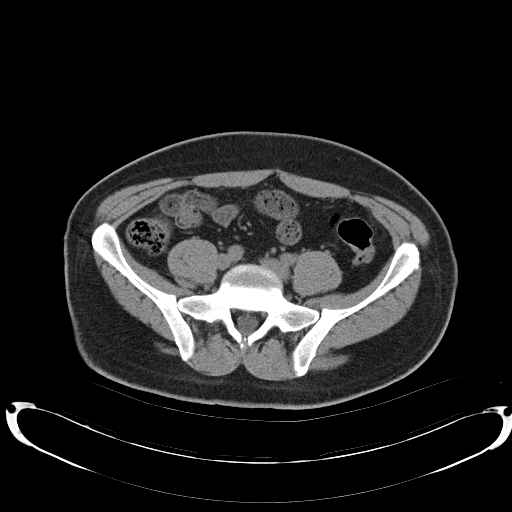
[im 46/98  soft-tissue]
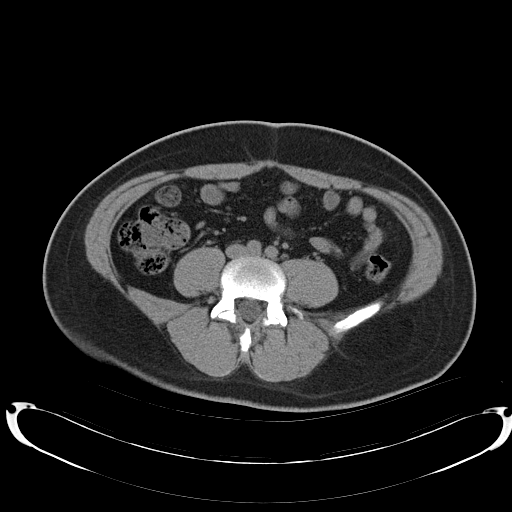
[im 52/98  soft-tissue]
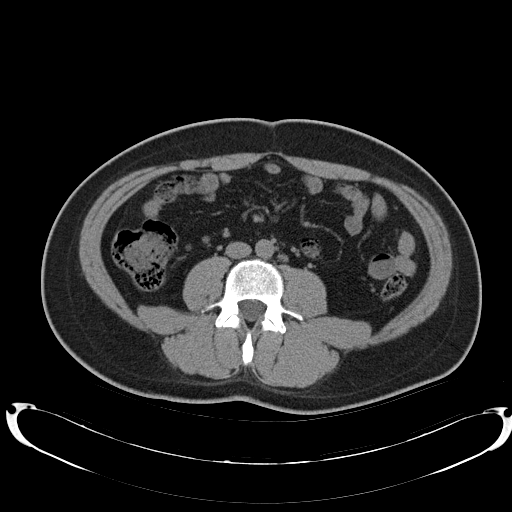
[im 59/98  soft-tissue]
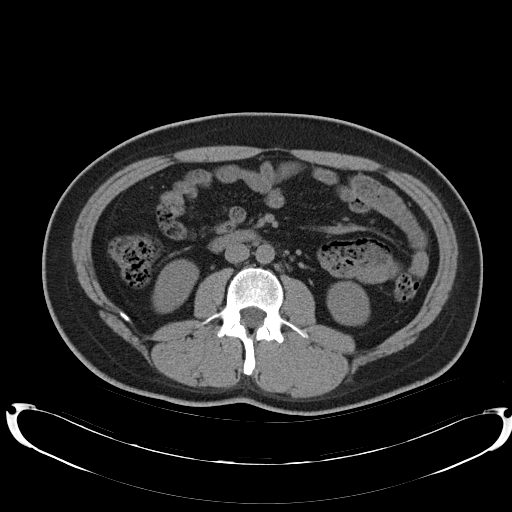
[im 59/98  bone]
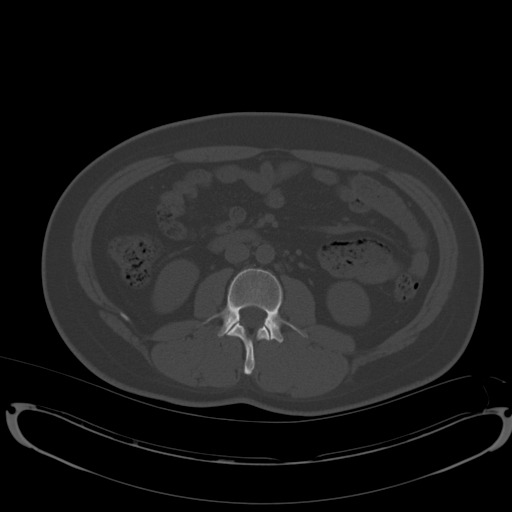
[im 65/98  soft-tissue]
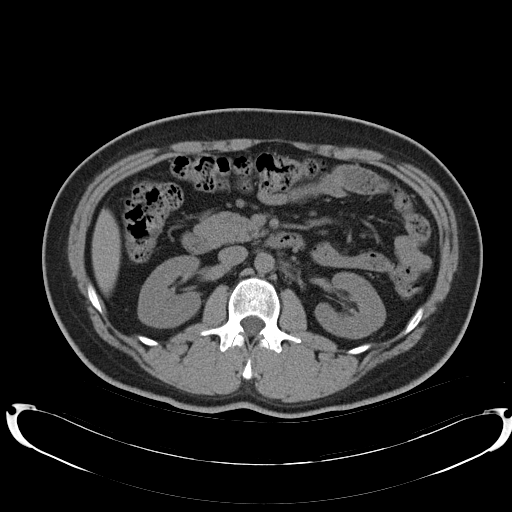
[im 72/98  soft-tissue]
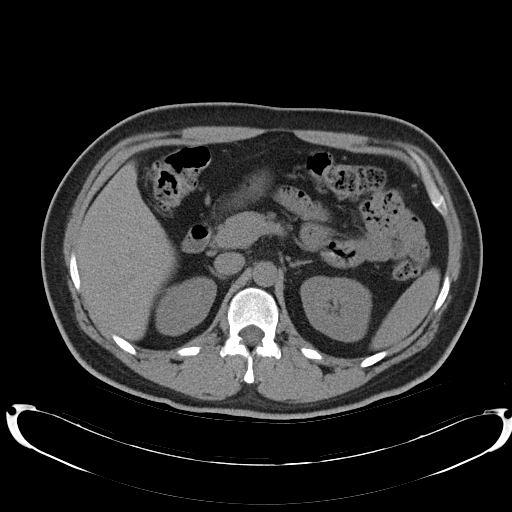
[im 78/98  soft-tissue]
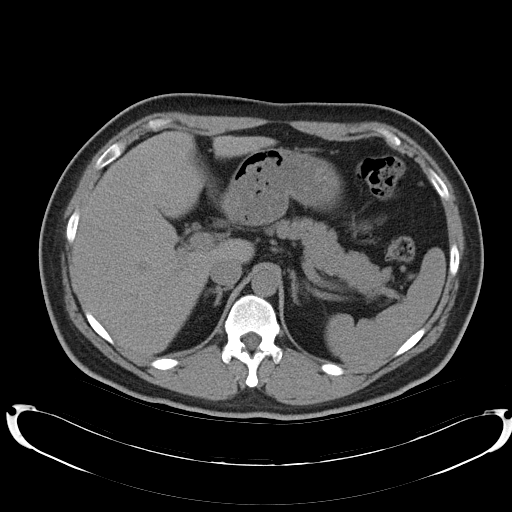
[im 85/98  soft-tissue]
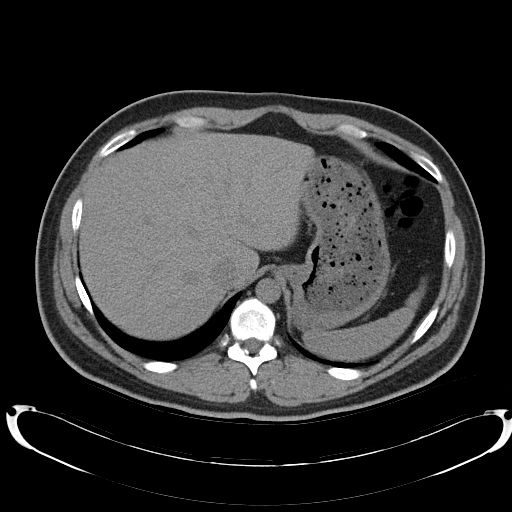
[im 91/98  soft-tissue]
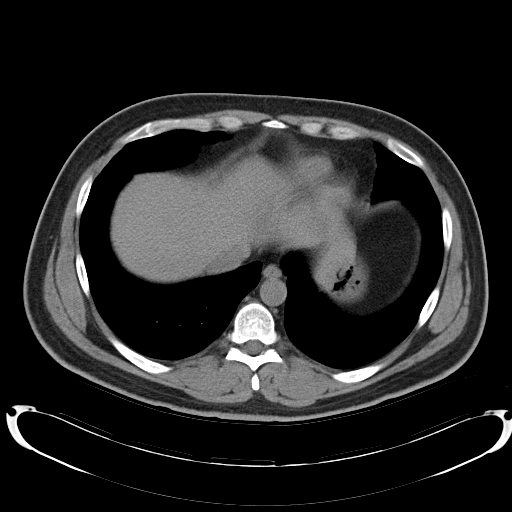

[Series 4: mpr cor (id) · coronal · 0.96mm/px · 3 of 72 slices shown]
[im 24/72  soft-tissue]
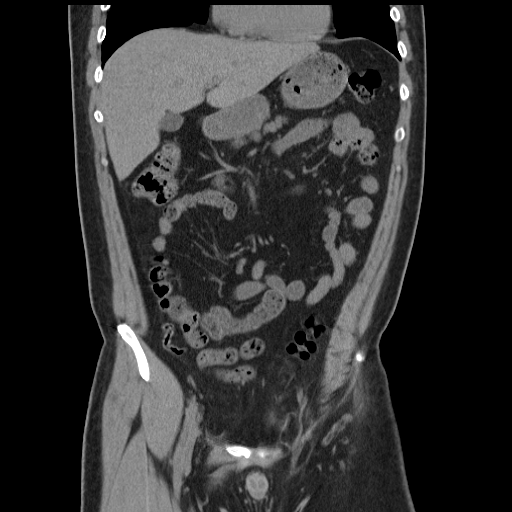
[im 32/72  soft-tissue]
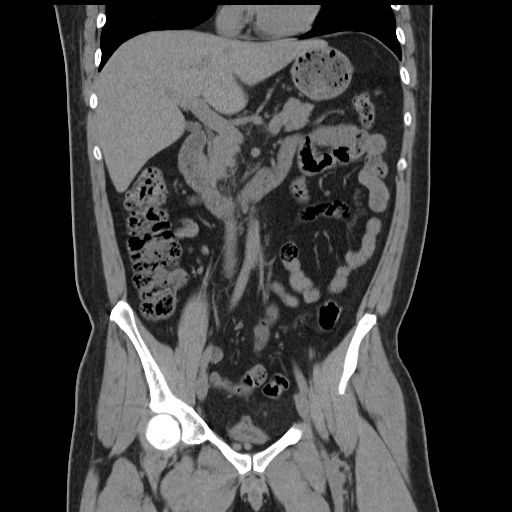
[im 40/72  soft-tissue]
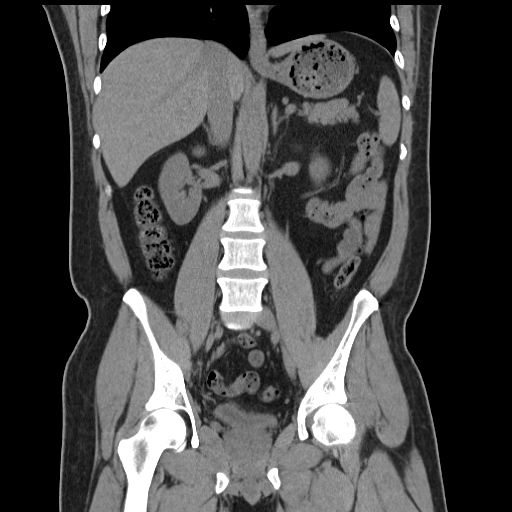

[17 of 46 positions shown; findings below may reference images not displayed]

FINDINGS: Lung bases clear.
Small cyst within upper pole of left kidney on previous exam is
less well visualized on the current study without contrast.
Within limits of a nonenhanced exam, no additional focal
abnormalities of the liver, spleen, pancreas, kidneys, or adrenal
glands.
Few scattered normal-sized lymph nodes medial to the right colon.
Normal appendix.
Decompressed bladder.
Minimal enlargement of prostate gland.
Mildly dilated bilateral internal inguinal rings and left inguinal
canal.
Stomach and bowel loops normal appearance for technique.
No mass, adenopathy, free fluid, or inflammatory process.
Osseous structures unremarkable.
IMPRESSION: No acute intra abdominal or intrapelvic abnormalities.
Minimal prostatic enlargement and question small left inguinal
hernia.

## 2013-07-16 ENCOUNTER — Other Ambulatory Visit: Payer: Self-pay | Admitting: Nurse Practitioner

## 2013-07-16 ENCOUNTER — Encounter: Payer: Self-pay | Admitting: Nurse Practitioner

## 2013-07-16 ENCOUNTER — Ambulatory Visit: Payer: Self-pay | Admitting: Nurse Practitioner

## 2013-07-16 ENCOUNTER — Encounter (INDEPENDENT_AMBULATORY_CARE_PROVIDER_SITE_OTHER): Payer: Self-pay

## 2013-07-16 ENCOUNTER — Telehealth: Payer: Self-pay | Admitting: Nurse Practitioner

## 2013-07-16 VITALS — BP 145/94 | HR 72 | Temp 97.8°F | Ht 68.0 in | Wt 200.0 lb

## 2013-07-16 DIAGNOSIS — L290 Pruritus ani: Secondary | ICD-10-CM

## 2013-07-16 MED ORDER — FLUCONAZOLE 150 MG PO TABS: 150.0000 mg | ORAL_TABLET | Freq: Once | ORAL | Status: DC

## 2013-07-16 MED ORDER — MEBENDAZOLE 100 MG PO CHEW: 100.0000 mg | CHEWABLE_TABLET | Freq: Two times a day (BID) | ORAL | Status: DC

## 2013-07-16 MED ORDER — ALBENDAZOLE 200 MG PO TABS: 400.0000 mg | ORAL_TABLET | Freq: Once | ORAL | Status: DC

## 2013-07-16 NOTE — Patient Instructions (Signed)
Yeast Infection of the Skin Some yeast on the skin is normal, but sometimes it causes an infection. If you have a yeast infection, it shows up as white or light brown patches on brown skin. You can see it better in the summer on tan skin. It causes light-colored holes in your suntan. It can happen on any area of the body. This cannot be passed from person to person. HOME CARE  Scrub your skin daily with a dandruff shampoo. Your rash may take a couple weeks to get well.  Do not scratch or itch the rash. GET HELP RIGHT AWAY IF:   You get another infection from scratching. The skin may get warm, red, and may ooze fluid.  The infection does not seem to be getting better. MAKE SURE YOU:  Understand these instructions.  Will watch your condition.  Will get help right away if you are not doing well or get worse. Document Released: 07/21/2008 Document Revised: 10/31/2011 Document Reviewed: 07/21/2008 [employer] Patient Information 2014 [employer].  

## 2013-07-16 NOTE — Telephone Encounter (Signed)
Already taken care of

## 2013-07-16 NOTE — Progress Notes (Signed)
  Subjective:    Patient ID: Mark Maddox, male    DOB: 05-30-1981, 32 y.o.   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    Review of Systems  All other systems reviewed and are negative.       Objective:   Physical Exam  Constitutional: He appears well-developed and well-nourished.  Cardiovascular: Normal rate, regular rhythm, normal heart sounds and intact distal pulses.   Pulmonary/Chest: Effort normal and breath sounds normal.  Genitourinary:  No anal irritation noted- no lesions- no erythema    BP 145/94  Pulse 72  Temp(Src) 97.8 F (36.6 C) (Oral)  Ht 5\' 8"  (1.727 m)  Wt 200 lb (90.719 kg)  BMI 30.42 kg/m2        Assessment & Plan:   1. Rectal itching    Meds ordered this encounter  Medications  . mebendazole (VERMOX) 100 MG chewable tablet    Sig: Chew 1 tablet (100 mg total) by mouth 2 (two) times daily.    Dispense:  1 tablet    Refill:  0    Order Specific Question:  Supervising Provider    Answer:  Ernestina Penna [[id number]]  . fluconazole (DIFLUCAN) 150 MG tablet    Sig: Take 1 tablet (150 mg total) by mouth once. A week for 4 weeks    Dispense:  4 tablet    Refill:  0    Order Specific Question:  Supervising Provider    Answer:  Ernestina Penna [[id number]]   Avoid scratching  Keep area clean and dry  Mary-Margaret Daphine Deutscher, FNP

## 2013-08-19 ENCOUNTER — Telehealth: Payer: Self-pay | Admitting: Nurse Practitioner

## 2013-08-19 NOTE — Telephone Encounter (Signed)
appt scheduled for wed with bill oxford for std check patient doesn't have inus infection.

## 2013-08-21 ENCOUNTER — Encounter: Payer: Self-pay | Admitting: Family Medicine

## 2013-08-21 ENCOUNTER — Ambulatory Visit: Payer: Self-pay | Admitting: Family Medicine

## 2013-08-21 VITALS — BP 137/88 | HR 87 | Temp 98.3°F | Ht 68.0 in | Wt 199.8 lb

## 2013-08-21 DIAGNOSIS — N342 Other urethritis: Secondary | ICD-10-CM

## 2013-08-21 MED ORDER — CEFTRIAXONE SODIUM 1 G IJ SOLR
1.0000 g | INTRAMUSCULAR | Status: AC
  Administered 2013-08-21: 1 g via INTRAMUSCULAR

## 2013-08-21 MED ORDER — DOXYCYCLINE HYCLATE 100 MG PO TABS: 100.0000 mg | ORAL_TABLET | Freq: Two times a day (BID) | ORAL | Status: DC

## 2013-08-21 MED ORDER — AZITHROMYCIN 250 MG PO TABS: ORAL_TABLET | ORAL | Status: DC

## 2013-08-21 NOTE — Progress Notes (Signed)
   Subjective:    Patient ID: DEVARION MCCLANAHAN, male    DOB: March 28, 1981, 32 y.o.   MRN: [id number]  HPI This 32 y.o. male presents for evaluation of std exposure and c/o urethritis and dysuria He was told by his girlfriend she tested positive for GC an chlamydia.  He does have some burning with urination.   Review of Systems C/o dysuria No chest pain, SOB, HA, dizziness, vision change, N/V, diarrhea, constipation,  myalgias, arthralgias or rash.     Objective:   Physical Exam Vital signs noted  Well developed well nourished male.  HEENT - Head atraumatic Normocephalic                Eyes - PERRLA, Conjuctiva - clear Sclera- Clear EOMI                Ears - EAC's Wnl TM's Wnl Gross Hearing WNL                Nose - Nares patent                 Throat - oropharanx wnl Respiratory - Lungs CTA bilateral Cardiac - RRR S1 and S2 without murmur GI - Abdomen soft Nontender and bowel sounds active x 4 GU - Urethral drainage and tenderness.       Assessment & Plan:  Urethritis - Plan: cefTRIAXone (ROCEPHIN) injection 1 g, doxycycline (VIBRA-TABS) 100 MG tablet, DISCONTINUED: azithromycin (ZITHROMAX) 250 MG tablet

## 2013-08-21 NOTE — Patient Instructions (Signed)
Gonorrhea, Females and Males  Gonorrhea is an infection. Gonorrhea can be treated with medicines that kill germs (antibiotics). It is necessary that all your sexual partners also be tested for infection and possibly be treated.   CAUSES   Gonorrhea is caused by a germ (bacteria) called Neisseria gonorrhoeae. This infection is spread by sexual contact. The contact that spreads gonorrhea from person to person may be oral, anal, or genital sex.  SYMPTOMS   Females  A woman may have gonorrhea infection and no symptoms. The most common symptoms are:   Pain in the lower abdomen.   Fever, with or without chills.  When these are the most serious problems, the illness is commonly called pelvic inflammatory disease (PID). Other symptoms include:   Abnormal vaginal discharge.   Painful intercourse.   Burning or itching of the vagina or lips of the vagina.   Abnormal vaginal bleeding.   Pain when urinating.  If the infection is spread by anal sex:   Irritation, pain, bleeding, or discharge from the rectum.  If the infection is spread by oral sex with either a man or a woman:   Sore throat, fever, and swollen neck lymph glands.  Other problems may include:   Long-lasting (chronic) pain in the lower abdomen during menstruation, intercourse, or at other times.   Inability to become pregnant.   Premature birth.   Passing the infection onto a newborn baby. This can cause an eye infection in the infant or more serious health problems.  Males  Less frequently than in women, men may have gonorrhea infection and no symptoms. The most common symptoms are:   Discharge from the penis.   Pain or burning during urination.  If the infection is spread by anal sex:   Irritation, pain, bleeding, or discharge from the rectum.  If the infection is spread by oral sex with either a man or a woman:   Sore throat, fever, and swollen neck lymph glands.  DIAGNOSIS   Diagnosis is made by exam of the patient and checking a sample of  discharge under a microscope for the presence of the bacteria. Discharge may be taken from the urethra, cervix, throat, or rectum.  TREATMENT   It is important to diagnose and treat gonorrhea as soon as possible. This prevents damage to the male or male organs or harm to the newborn baby of an infected woman.   Antibiotics are used to treat gonorrhea.   Your sex partners should also be examined and treated if needed.   Testing and treatment for other sexually transmitted diseases (STDs) may be done when you are diagnosed with gonorrhea. Gonorrhea is an STD. You are at risk for other STDs, which are often transmitted around the same time as gonorrhea. These include:   Chlamydia.   Syphilis.   Trichomonas.   Human papillomavirus (HPV).   Human immunodeficiency virus (HIV).   If left untreated, PID can cause women to be unable to have children (sterile). To prevent sterility in females, it is important to be treated as soon as possible and finish all medicines. Unfortunately, sterility or pregnancy occurring outside the uterus (ectopic) may still occur in fully treated women.  HOME CARE INSTRUCTIONS    Finish all medicine as prescribed. Incomplete treatment will put you at risk for continued infection.   Only take over-the-counter or prescription medicines for pain, discomfort, or fever as directed by your caregiver.   Do not have sex until treatment is completed, or as instructed   out the results of your test Not all test results are available during your visit. If your test results are not back during the visit, make an appointment with your caregiver to find out the results. Do not assume everything is normal if you have not  heard from your caregiver or the medical facility. It is important for you to follow up on all of your test results. SEEK MEDICAL CARE IF:   You develop any bad reaction to the medicine you were prescribed. This may include:  Rash.  Nausea.  Vomiting.  Diarrhea.  You have an oral temperature above 102 F (38.9 C).  You have symptoms that do not improve, symptoms that get worse, or you develop increased pain. Males may get pain in the testicles and females may get increased abdominal pain. MAKE SURE YOU:   Understand these instructions.  Will watch your condition.  Will get help right away if you are not doing well or get worse. Document Released: 08/05/2000 Document Revised: 10/31/2011 Document Reviewed: 02/13/2013 [facility] Patient Information 2014 [place], [place].

## 2013-11-30 IMAGING — CR DG CHEST 2V
2 series · 2 of 2 positions shown · non-contrast
Comparison: None.

CLINICAL DATA: Hypertension

CHEST - 2 VIEW

[view not recorded (1 of 2)]
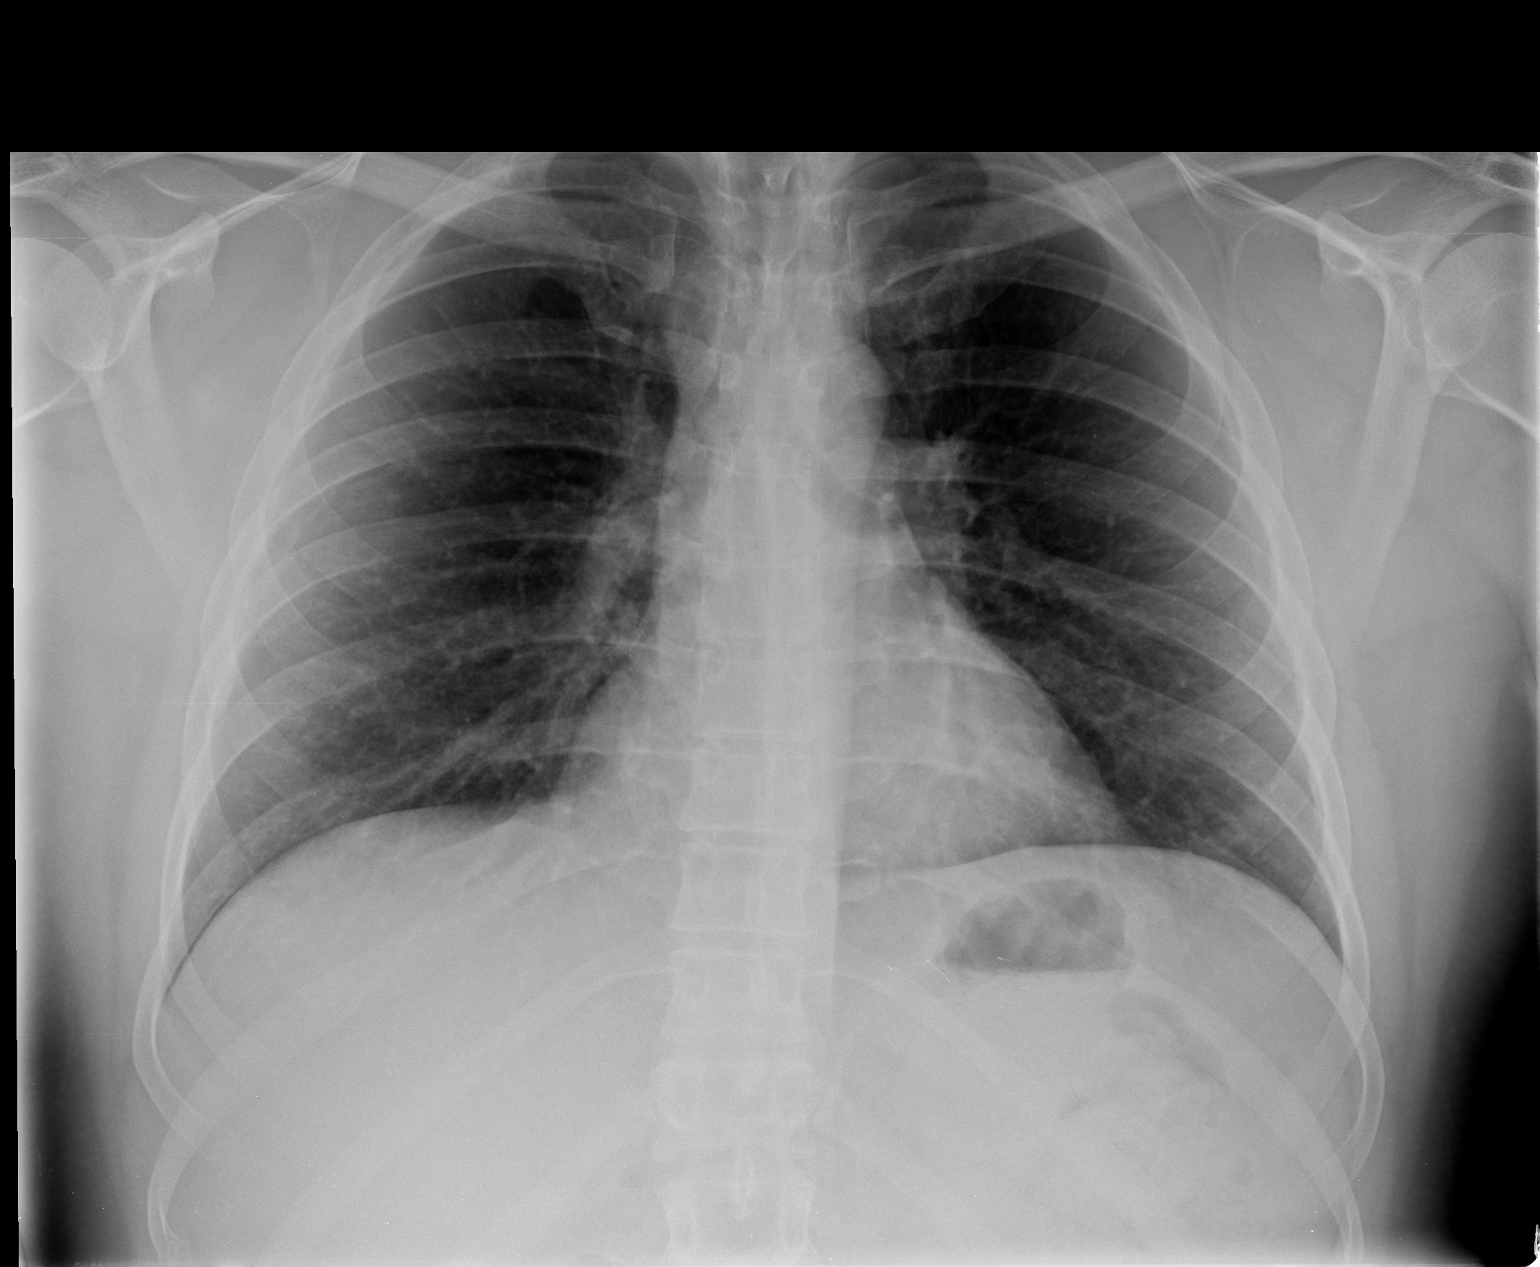

[view not recorded (2 of 2)]
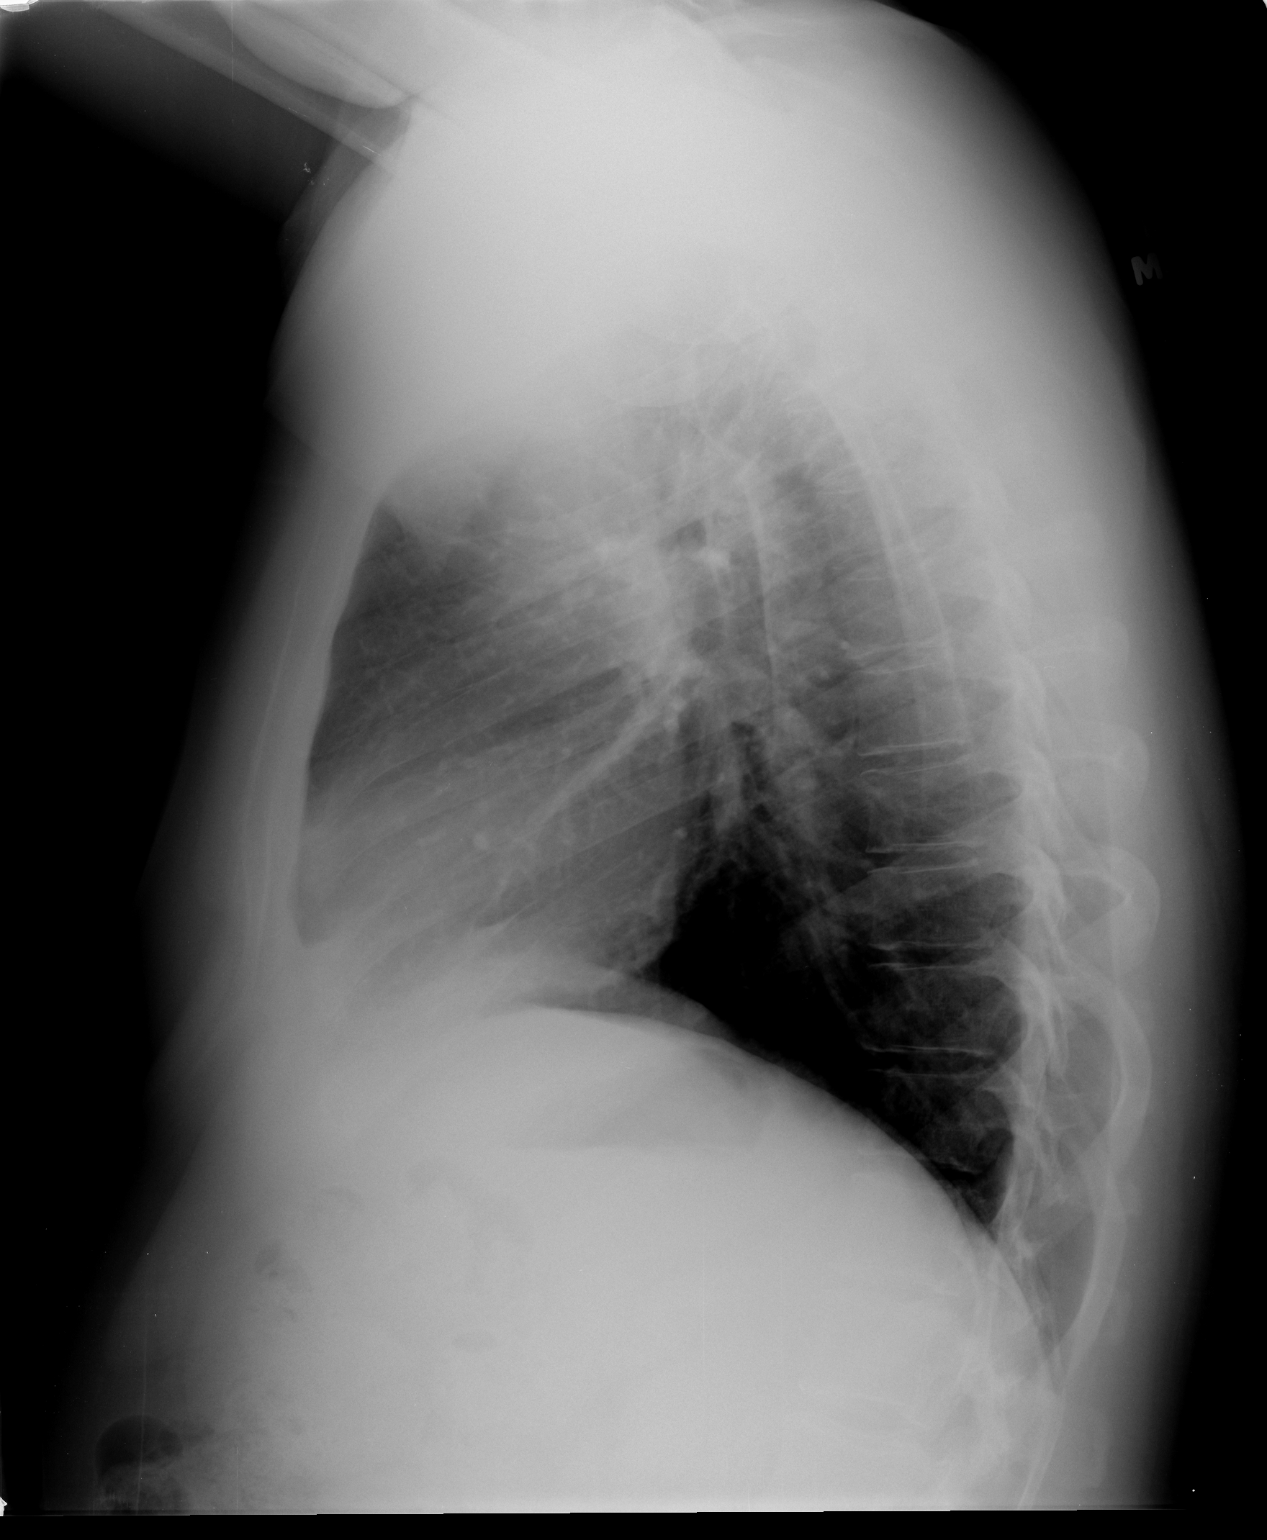

[2 of 2 positions shown; findings below may reference images not displayed]

FINDINGS: Lungs clear.  Heart size and pulmonary vascularity are
normal.  No adenopathy.  There is slight upper thoracic
levoscoliosis.
IMPRESSION: Lungs clear.

## 2016-05-23 ENCOUNTER — Ambulatory Visit (INDEPENDENT_AMBULATORY_CARE_PROVIDER_SITE_OTHER): Payer: Managed Care, Other (non HMO) | Admitting: Pediatrics

## 2016-05-23 ENCOUNTER — Encounter: Payer: Self-pay | Admitting: Pediatrics

## 2016-05-23 VITALS — BP 143/92 | HR 72 | Temp 97.8°F | Ht 68.0 in | Wt 192.2 lb

## 2016-05-23 DIAGNOSIS — I10 Essential (primary) hypertension: Secondary | ICD-10-CM

## 2016-05-23 DIAGNOSIS — B9789 Other viral agents as the cause of diseases classified elsewhere: Secondary | ICD-10-CM | POA: Diagnosis not present

## 2016-05-23 DIAGNOSIS — J069 Acute upper respiratory infection, unspecified: Secondary | ICD-10-CM

## 2016-05-23 DIAGNOSIS — J45909 Unspecified asthma, uncomplicated: Secondary | ICD-10-CM | POA: Diagnosis not present

## 2016-05-23 MED ORDER — GUAIFENESIN-CODEINE 100-10 MG/5ML PO SOLN: 10.0000 mL | Freq: Three times a day (TID) | ORAL | 0 refills | Status: DC | PRN

## 2016-05-23 MED ORDER — ALBUTEROL SULFATE HFA 108 (90 BASE) MCG/ACT IN AERS: 2.0000 | INHALATION_SPRAY | Freq: Four times a day (QID) | RESPIRATORY_TRACT | 2 refills | Status: DC | PRN

## 2016-05-23 MED ORDER — LOSARTAN POTASSIUM 25 MG PO TABS: 25.0000 mg | ORAL_TABLET | Freq: Every day | ORAL | 1 refills | Status: DC

## 2016-05-23 NOTE — Patient Instructions (Signed)
Salt spray as needed for sinuses flonase two sprays each nare daily Tylenol and ibuprofen as needed Albuterol as needed for cough

## 2016-05-23 NOTE — Progress Notes (Signed)
  Subjective:   Patient ID: Mark Maddox, male    DOB: [birth year], 35 y.o.   MRN: [id number] CC: Cough; Sore Throat; Fever; and Nasal Congestion  HPI: Mark Maddox is a 35 y.o. 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 Allergies and medications reviewed and updated. History  Smoking Status  . Never Smoker  Smokeless Tobacco  . Never Used   ROS: Per HPI   Objective:    BP (!) 144/95   Pulse 80   Temp 97.8 F (36.6 C) (Oral)   Ht 5\' 8"  (1.727 m)   Wt 192 lb 3.2 oz (87.2 kg)   BMI 29.22 kg/m   Wt Readings from Last 3 Encounters:  05/23/16 192 lb 3.2 oz (87.2 kg)  08/21/13 199 lb 12.8 oz (90.6 kg)  07/16/13 200 lb (90.7 kg)    Gen: NAD, alert, cooperative with exam, NCAT, congested EYES: EOMI, no conjunctival injection, or no icterus ENT:  TMs pearly gray b/l, OP with mild erythema, no tenderness over sinuses LYMPH: no cervical LAD CV: NRRR, normal S1/S2, no murmur, distal pulses 2+ b/l Resp: CTABL, no wheezes, normal WOB Ext: No edema, warm Neuro: Alert and oriented, strength equal b/l UE and LE, coordination grossly normal MSK: normal muscle bulk  Assessment & Plan:  Mark Maddox was seen today for cough, sore throat, fever and nasal congestion.  Diagnoses and all orders for this visit:  Essential hypertension Remains elevated Most recent BPs in system also elevated Elevated at home Has been avoiding salt Discussed trying a few months of weight loss, increased exercise, salt reduction Pt wants to try  medication, says he has been making changes and not helping BPs -     Basic metabolic panel -     losartan (COZAAR) 25 MG tablet; Take 1 tablet (25 mg total) by mouth daily. RTC 4 weeks for HTN f/u, recheck BMP  Viral URI with cough Discussed symptomatic care Sinus rinses Tylenol prn flonase Cough medicine, dont take before driving -     guaiFENesin-codeine 100-10 MG/5ML syrup; Take 10 mLs by mouth 3 (three) times daily as needed for cough.  Reactive airway disease without complication, unspecified asthma severity, unspecified whether persistent Has needed albuteorl in the past to help with cough associated with viral URIs -     albuterol (PROVENTIL HFA;VENTOLIN HFA) 108 (90 Base) MCG/ACT inhaler; Inhale 2 puffs into the lungs every 6 (six) hours as needed for wheezing or shortness of breath.   Follow up plan: Return in about 4 weeks (around 06/20/2016) for HTN f/u. Rex Krasarol Timothy Trudell, MD [facility]

## 2016-05-24 LAB — BASIC METABOLIC PANEL
BUN/Creatinine Ratio: 11 (ref 9–20)
BUN: 11 mg/dL (ref 6–20)
CO2: 27 mmol/L (ref 18–29)
Calcium: 9.5 mg/dL (ref 8.7–10.2)
Chloride: 99 mmol/L (ref 96–106)
Creatinine, Ser: 1 mg/dL (ref 0.76–1.27)
GFR calc Af Amer: 111 mL/min/{1.73_m2} (ref >59)
GFR calc non Af Amer: 96 mL/min/{1.73_m2} (ref >59)
Glucose: 90 mg/dL (ref 65–99)
Potassium: 4.3 mmol/L (ref 3.5–5.2)
Sodium: 141 mmol/L (ref 134–144)

## 2016-06-23 ENCOUNTER — Ambulatory Visit: Payer: Managed Care, Other (non HMO) | Admitting: Pediatrics

## 2016-06-24 ENCOUNTER — Encounter: Payer: Self-pay | Admitting: Pediatrics

## 2016-06-24 ENCOUNTER — Ambulatory Visit (INDEPENDENT_AMBULATORY_CARE_PROVIDER_SITE_OTHER): Payer: Managed Care, Other (non HMO) | Admitting: Pediatrics

## 2016-06-24 VITALS — BP 122/92 | HR 62 | Temp 97.8°F | Ht 68.0 in | Wt 193.4 lb

## 2016-06-24 DIAGNOSIS — I10 Essential (primary) hypertension: Secondary | ICD-10-CM

## 2016-06-24 DIAGNOSIS — R3915 Urgency of urination: Secondary | ICD-10-CM | POA: Diagnosis not present

## 2016-06-24 DIAGNOSIS — R81 Glycosuria: Secondary | ICD-10-CM | POA: Diagnosis not present

## 2016-06-24 LAB — MICROSCOPIC EXAMINATION
Bacteria, UA: NONE SEEN
RBC, UA: NONE SEEN /hpf (ref 0–?)

## 2016-06-24 LAB — URINALYSIS, COMPLETE
Bilirubin, UA: NEGATIVE
Leukocytes, UA: NEGATIVE
Nitrite, UA: NEGATIVE
RBC, UA: NEGATIVE
Specific Gravity, UA: 1.025 (ref 1.005–1.030)
Urobilinogen, Ur: 1 mg/dL (ref 0.2–1.0)
pH, UA: 6 (ref 5.0–7.5)

## 2016-06-24 NOTE — Progress Notes (Signed)
  Subjective:   Patient ID: Mark Maddox, male    DOB: 1981/02/23, 35 y.o.   MRN: [id number] CC: Follow-up (Hypertension) and Urinary Frequency  HPI: Mark Maddox is a 35 y.o. 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 Allergies and medications reviewed and updated. History  Smoking Status  . Never Smoker  Smokeless Tobacco  . Never Used   ROS: Per HPI   Objective:    BP (!) 122/92   Pulse 62   Temp 97.8 F (36.6 C) (Oral)   Ht '5\' 8"'$  (1.727 m)   Wt 193 lb 6.4 oz (87.7 kg)   BMI 29.41 kg/m   Wt Readings from Last 3 Encounters:  06/24/16 193 lb 6.4 oz (87.7 kg)  05/23/16 192 lb 3.2 oz (87.2 kg)  08/21/13 199 lb 12.8 oz (90.6 kg)    Gen: NAD, alert, cooperative with exam, NCAT EYES: EOMI, no conjunctival injection, or no icterus ENT:  OP without erythema LYMPH: no cervical LAD CV: NRRR, normal S1/S2, no murmur, distal pulses 2+ b/l Resp: CTABL, no wheezes, normal WOB Abd: +BS, soft, NTND. no guarding or organomegaly Ext: No edema, warm Neuro: Alert and oriented, strength equal b/l UE and LE, coordination grossly normal MSK: normal muscle bulk  Assessment & Plan:  Mark Maddox was seen today for follow-up and urinary frequency.  Diagnoses and all orders for this visit:  Essential hypertension Adequate control, cont current med Check at home, let     Urinary urgency Occurs for about an hour after he lays down to go to sleep No trouble during the day Drinks sweet tea at lunch and with dinner, water otherwise Denies STi exposure, dysuria, discharge Cut out caffeine intake at night, no fluids after dinner -     BMP8+EGFR -     Urinalysis, Complete  Glucosuria -     Bayer DCA Hb A1c Waived  Other orders -     Microscopic Examination   Follow up plan: 5 mo Assunta Found, MD [place]

## 2016-06-25 LAB — BMP8+EGFR
BUN/Creatinine Ratio: 16 (ref 9–20)
BUN: 15 mg/dL (ref 6–20)
CO2: 26 mmol/L (ref 18–29)
Calcium: 9.8 mg/dL (ref 8.7–10.2)
Chloride: 103 mmol/L (ref 96–106)
Creatinine, Ser: 0.91 mg/dL (ref 0.76–1.27)
GFR calc Af Amer: 125 mL/min/{1.73_m2} (ref >59)
GFR calc non Af Amer: 108 mL/min/{1.73_m2} (ref >59)
Glucose: 96 mg/dL (ref 65–99)
Potassium: 4.6 mmol/L (ref 3.5–5.2)
Sodium: 146 mmol/L — ABNORMAL HIGH (ref 134–144)

## 2016-07-02 ENCOUNTER — Other Ambulatory Visit: Payer: Self-pay | Admitting: Pediatrics

## 2016-07-02 DIAGNOSIS — I10 Essential (primary) hypertension: Secondary | ICD-10-CM

## 2016-07-02 MED ORDER — LOSARTAN POTASSIUM 50 MG PO TABS: 50.0000 mg | ORAL_TABLET | Freq: Every day | ORAL | 1 refills | Status: DC

## 2016-07-02 NOTE — Progress Notes (Signed)
Spoke with pt BPs remain elevated at home. Increase to 50mg  daily losartan, refill sent in

## 2016-07-04 ENCOUNTER — Other Ambulatory Visit: Payer: Self-pay | Admitting: *Deleted

## 2016-07-16 ENCOUNTER — Other Ambulatory Visit: Payer: Self-pay | Admitting: Pediatrics

## 2016-07-16 DIAGNOSIS — I10 Essential (primary) hypertension: Secondary | ICD-10-CM

## 2016-07-19 ENCOUNTER — Other Ambulatory Visit: Payer: Self-pay | Admitting: *Deleted

## 2016-07-19 DIAGNOSIS — I10 Essential (primary) hypertension: Secondary | ICD-10-CM

## 2016-07-19 MED ORDER — LOSARTAN POTASSIUM 25 MG PO TABS: 25.0000 mg | ORAL_TABLET | Freq: Every day | ORAL | 1 refills | Status: DC

## 2016-09-21 ENCOUNTER — Ambulatory Visit: Payer: Managed Care, Other (non HMO) | Admitting: Pediatrics

## 2017-01-17 ENCOUNTER — Ambulatory Visit (INDEPENDENT_AMBULATORY_CARE_PROVIDER_SITE_OTHER): Payer: Managed Care, Other (non HMO) | Admitting: Family Medicine

## 2017-01-17 ENCOUNTER — Encounter: Payer: Self-pay | Admitting: Family Medicine

## 2017-01-17 VITALS — BP 141/85 | HR 99 | Temp 97.5°F | Ht 68.0 in | Wt 202.8 lb

## 2017-01-17 DIAGNOSIS — N401 Enlarged prostate with lower urinary tract symptoms: Secondary | ICD-10-CM

## 2017-01-17 DIAGNOSIS — R351 Nocturia: Secondary | ICD-10-CM | POA: Diagnosis not present

## 2017-01-17 DIAGNOSIS — R1032 Left lower quadrant pain: Secondary | ICD-10-CM

## 2017-01-17 LAB — BAYER DCA HB A1C WAIVED: HB A1C (BAYER DCA - WAIVED): 5.3 % (ref <7.0)

## 2017-01-17 MED ORDER — TAMSULOSIN HCL 0.4 MG PO CAPS: 0.4000 mg | ORAL_CAPSULE | Freq: Every day | ORAL | 3 refills | Status: DC

## 2017-01-17 NOTE — Progress Notes (Signed)
   HPI  Patient presents today here to discuss abdominal pain and nocturia.  Nocturia Long-standing symptoms for many months, patient was seen for this about 6 months ago. Patient describes 5-6 episodes of nocturia a night, he states that he does not have frequent urination in the daytime. However he does have sensation of incomplete voiding, dribbling, and urinary hesitancy at times. 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  Abdominal pain Left lower quadrant abdominal pain, patient has been associating this with a previous hernia surgery. He had bilateral inguinal hernias repaired with mesh 5 years ago, the pain began minimally after 5 years ago but now seems to be getting worse. Patient denies any bulge or recurrence of hernia. No fever, chills, sweats. Requesting GI referral, states that he has large amounts of mucus in the stool most the time, alternating constipation and diarrhea, and sometimes a small amount of blood in the stool. Nominal pain at times wakes him up from sleep. He does not seem to have any clear aggravating or alleviating factors, it is not worsened by exertion straining.   PMH: Smoking status noted ROS: Per HPI  Objective: BP (!) 141/85   Pulse 99   Temp 97.5 F (36.4 C) (Oral)   Ht _0  (1.727 m)   Wt 202 lb 12.8 oz (92 kg)   BMI 30.84 kg/m  Gen: NAD, alert, cooperative with exam HEENT: NCAT CV: RRR, good S1/S2, no murmur Resp: CTABL, no wheezes, non-labored Abd: Soft, positive bowel sounds, tenderness to palpation over the left lower quadrant/upper pelvic area, not over the inguinal canal. No guarding or rebound Ext: No edema, warm Neuro: Alert and oriented, No gross deficits DRE: slightly Large smooth symmetric prostate without nodules  Assessment and plan:  # BPH Unusually large prostate given his age, PSA check today Start Flomax Consider urology referral if no improvement or if PSA is abnormal  # Abd  pain Patient with persistent abdominal pain for years from his report, associated with mucus in the stool and at times blood. Labs today Unclear if this is related to his previous hernia surgery, by my exam I don't believe it is. Refer to GI for their opinion    Orders Placed This Encounter  Procedures  . CBC with Differential/Platelet  . CMP14+EGFR  . Bayer DCA Hb A1c Waived  . PSA  . Ambulatory referral to Gastroenterology    Referral Priority:   Routine    Referral Type:   Consultation    Referral Reason:   Specialty Services Required    Number of Visits Requested:   1    Meds ordered this encounter  Medications  . tamsulosin (FLOMAX) 0.4 MG CAPS capsule    Sig: Take 1 capsule (0.4 mg total) by mouth daily.    Dispense:  30 capsule    Refill:  [place], MD [place] 01/17/2017, 12:02 PM

## 2017-01-17 NOTE — Patient Instructions (Signed)
Great to meet you!  Satrt flomax 1 pill once a day and allow a few weeks for the effects to set in.   We are checking your labs, and I have sent you to a GI doctor for your pain.

## 2017-01-18 LAB — CBC WITH DIFFERENTIAL/PLATELET
Basophils Absolute: 0 10*3/uL (ref 0.0–0.2)
Basos: 0 %
EOS (ABSOLUTE): 0.1 10*3/uL (ref 0.0–0.4)
Eos: 2 %
Hematocrit: 45.8 % (ref 37.5–51.0)
Hemoglobin: 15.6 g/dL (ref 13.0–17.7)
Immature Grans (Abs): 0 10*3/uL (ref 0.0–0.1)
Immature Granulocytes: 0 %
Lymphocytes Absolute: 1.5 10*3/uL (ref 0.7–3.1)
Lymphs: 34 %
MCH: 29.2 pg (ref 26.6–33.0)
MCHC: 34.1 g/dL (ref 31.5–35.7)
MCV: 86 fL (ref 79–97)
Monocytes Absolute: 0.4 10*3/uL (ref 0.1–0.9)
Monocytes: 9 %
Neutrophils Absolute: 2.5 10*3/uL (ref 1.4–7.0)
Neutrophils: 55 %
Platelets: 247 10*3/uL (ref 150–379)
RBC: 5.35 x10E6/uL (ref 4.14–5.80)
RDW: 13.6 % (ref 12.3–15.4)
WBC: 4.5 10*3/uL (ref 3.4–10.8)

## 2017-01-18 LAB — CMP14+EGFR
ALT: 114 IU/L — ABNORMAL HIGH (ref 0–44)
AST: 86 IU/L — ABNORMAL HIGH (ref 0–40)
Albumin/Globulin Ratio: 1.4 (ref 1.2–2.2)
Albumin: 4.7 g/dL (ref 3.5–5.5)
Alkaline Phosphatase: 75 IU/L (ref 39–117)
BUN/Creatinine Ratio: 9 (ref 9–20)
BUN: 9 mg/dL (ref 6–20)
Bilirubin Total: 0.4 mg/dL (ref 0.0–1.2)
CO2: 30 mmol/L — ABNORMAL HIGH (ref 18–29)
Calcium: 10.2 mg/dL (ref 8.7–10.2)
Chloride: 98 mmol/L (ref 96–106)
Creatinine, Ser: 0.96 mg/dL (ref 0.76–1.27)
GFR calc Af Amer: 117 mL/min/{1.73_m2} (ref >59)
GFR calc non Af Amer: 101 mL/min/{1.73_m2} (ref >59)
Globulin, Total: 3.4 g/dL (ref 1.5–4.5)
Glucose: 123 mg/dL — ABNORMAL HIGH (ref 65–99)
Potassium: 4.1 mmol/L (ref 3.5–5.2)
Sodium: 142 mmol/L (ref 134–144)
Total Protein: 8.1 g/dL (ref 6.0–8.5)

## 2017-01-18 LAB — PSA: Prostate Specific Ag, Serum: 1.4 ng/mL (ref 0.0–4.0)

## 2017-01-19 ENCOUNTER — Telehealth: Payer: Self-pay | Admitting: Family Medicine

## 2017-01-19 NOTE — Telephone Encounter (Signed)
Pt aware of results and MD feedback.

## 2017-01-20 ENCOUNTER — Other Ambulatory Visit (INDEPENDENT_AMBULATORY_CARE_PROVIDER_SITE_OTHER): Payer: 59

## 2017-01-20 ENCOUNTER — Ambulatory Visit (INDEPENDENT_AMBULATORY_CARE_PROVIDER_SITE_OTHER): Payer: 59 | Admitting: Gastroenterology

## 2017-01-20 ENCOUNTER — Encounter: Payer: Self-pay | Admitting: Gastroenterology

## 2017-01-20 VITALS — BP 130/72 | HR 82 | Ht 68.0 in | Wt 200.5 lb

## 2017-01-20 DIAGNOSIS — R7989 Other specified abnormal findings of blood chemistry: Secondary | ICD-10-CM

## 2017-01-20 DIAGNOSIS — R194 Change in bowel habit: Secondary | ICD-10-CM

## 2017-01-20 DIAGNOSIS — R945 Abnormal results of liver function studies: Principal | ICD-10-CM

## 2017-01-20 DIAGNOSIS — R1084 Generalized abdominal pain: Secondary | ICD-10-CM

## 2017-01-20 DIAGNOSIS — K625 Hemorrhage of anus and rectum: Secondary | ICD-10-CM

## 2017-01-20 LAB — FERRITIN: Ferritin: 353.5 ng/mL — ABNORMAL HIGH (ref 22.0–322.0)

## 2017-01-20 LAB — IBC PANEL
Iron: 128 ug/dL (ref 42–165)
Saturation Ratios: 34.4 % (ref 20.0–50.0)
Transferrin: 266 mg/dL (ref 212.0–360.0)

## 2017-01-20 LAB — HEPATITIS B SURFACE ANTIBODY,QUALITATIVE: Hep B S Ab: NEGATIVE

## 2017-01-20 LAB — C-REACTIVE PROTEIN: CRP: 1.1 mg/dL (ref 0.5–20.0)

## 2017-01-20 LAB — HEPATITIS A ANTIBODY, TOTAL: Hep A Total Ab: NONREACTIVE

## 2017-01-20 LAB — IGA: IgA: 652 mg/dL — ABNORMAL HIGH (ref 68–378)

## 2017-01-20 LAB — HEPATITIS C ANTIBODY: HCV Ab: NEGATIVE

## 2017-01-20 LAB — TSH: TSH: 0.87 u[IU]/mL (ref 0.35–4.50)

## 2017-01-20 LAB — HEPATITIS B SURFACE ANTIGEN: Hepatitis B Surface Ag: NEGATIVE

## 2017-01-20 MED ORDER — NA SULFATE-K SULFATE-MG SULF 17.5-3.13-1.6 GM/177ML PO SOLN: 1.0000 | ORAL | 0 refills | Status: DC

## 2017-01-20 NOTE — Progress Notes (Signed)
01/20/2017 OWYN RAULSTON [id number] Aug 05, 1981   HISTORY OF PRESENT ILLNESS:  This is a pleasant 36 year old male who is new to our office and was referred here by his PCP, Dr. Ermalinda Memos, for evaluation regarding GI complaints of abdominal pain, rectal bleeding, and change in bowel habits.  He tells me that had has been experiencing LLQ abdominal pain/cramping.  Has seen blood in his stools on occasion (maybe once a month he will see some with BM's including some blood in the commode) and sometimes notes a lot of mucus as well (described as a moderate amount of clear jelly).  Says that stools alternate from constipation to loose stools.  That has been going on for a while.  No definite triggers.  Has woken at night to have a BM but that happens only on rare occasion.  He tells me that he had bilateral inguinal hernia repair with mesh by Dr. Derrell Lolling in 06/2012.  Says that he was having some discomfort related to the hernias prior to that time, but the LLQ pain has persisted and has been consistent even following the surgery as well.  CBC and BMP are normal except slightly elevated glucose.    While he was here it was also noted that he has elevated liver enzymes.  Just a couple of days ago AST was 86 and ALT was 114.  We do not have any other for comparison except for 7 years ago at which time they were elevated with AST 41 and ALT 94.  Total bili and ALP have been normal.  He denies any ETOH at all.  Says that he used to drink some, but not excessively and since he was told that his liver enzymes were elevated years ago he has completely stopped drinking ETOH altogether.  Had a CT scan in 2011 that showed fatty liver.  Denies taking any medication, including OTC supplements, etc besides his flomax.   Past Medical History:  Diagnosis Date  . Asthma    had asthma as a child  . Enlarged prostate   . Hypertension    hx of   . Inguinal hernia bilateral, non-recurrent    Past Surgical History:    Procedure Laterality Date  . dental extractions     wisdom teeth  . HERNIA REPAIR    . INGUINAL HERNIA REPAIR  07/04/2012   Procedure: LAPAROSCOPIC BILATERAL INGUINAL HERNIA REPAIR;  Surgeon: Axel Filler, MD;  Location: [facility] OR;  Service: General;  Laterality: Bilateral;  . INSERTION OF MESH  07/04/2012   Procedure: INSERTION OF MESH;  Surgeon: Axel Filler, MD;  Location: [facility] OR;  Service: General;  Laterality: Bilateral;    reports that he has never smoked. He has never used smokeless tobacco. He reports that he does not drink alcohol or use drugs. family history includes Diabetes in his maternal grandfather and maternal grandmother; Heart disease in his maternal grandfather; Prostate cancer in his paternal grandfather. No Known Allergies    Outpatient Encounter Prescriptions as of 01/20/2017  Medication Sig  . tamsulosin (FLOMAX) 0.4 MG CAPS capsule Take 1 capsule (0.4 mg total) by mouth daily.   No facility-administered encounter medications on file as of 01/20/2017.      REVIEW OF SYSTEMS  : All other systems reviewed and negative except where noted in the History of Present Illness.   PHYSICAL EXAM: BP 130/72   Pulse 82   Ht 5\' 8"  (1.727 m)   Wt 200 lb 8 oz (90.9  kg)   BMI 30.49 kg/m  General: Well developed white male in no acute distress Head: Normocephalic and atraumatic Eyes:  Sclerae anicteric, conjunctiva pink. Ears: Normal auditory acuity Lungs: Clear throughout to auscultation; no increased WOB. Heart: Regular rate and rhythm Abdomen: Soft, non-distended.  BS present.  Non-tender. Rectal:  Will be done at the time of colonoscopy. Musculoskeletal: Symmetrical with no gross deformities  Skin: No lesions on visible extremities Extremities: No edema  Neurological: Alert oriented x 4, grossly non-focal Psychological:  Alert and cooperative. Normal mood and affect  ASSESSMENT AND PLAN: -Change in bowel habits, rectal bleeding, abdominal pain (mostly LLQ):   Will check TSH, CRP , and celiac labs.  Will also schedule for colonoscopy with Dr. Marina GoodellPerry to rule out IBD, less likely malignancy, etc.  ? If LLQ abdominal pain is related to inguinal hernia repair and mesh. -Elevated LFT's:  Patient denies ETOH use.  Fatty liver seen on CT scan in 2011.  Will repeat ultrasound at this time as well as checking complete serologic evaluation regarding causes for chronic liver disease.  *The risks, benefits, and alternatives to colonoscopy were discussed with the patient and he consents to proceed.    CC:  Elenora GammaBradshaw, Samuel L, MD

## 2017-01-20 NOTE — Patient Instructions (Signed)
[facility] have been [facility] of [facility] on 01-25-17 at 9:30 am. Please arrive 15 minutes prior to your appointment for registration. Make certain not to have anything to eat or drink 6 hours prior to your appointment. Should you need to reschedule your appointment, please contact radiology at [phone]. This test typically takes about 30 minutes to perform.  Your physician has requested that you go to the basement for lab work before leaving today.  You have been scheduled for a colonoscopy. Please follow written instructions given to you at your visit today.  Please pick up your prep supplies at the pharmacy within the next 1-3 days. If you use inhalers (even only as needed), please bring them with you on the day of your procedure. Your physician has requested that you go to www.startemmi.com and enter the access code given to you at your visit today. This web site gives a general overview about your procedure. However, you should still follow specific instructions given to you by our office regarding your preparation for the procedure.

## 2017-01-23 LAB — ALPHA-1-ANTITRYPSIN: A-1 Antitrypsin, Ser: 166 mg/dL (ref 83–199)

## 2017-01-23 LAB — CERULOPLASMIN: Ceruloplasmin: 24 mg/dL (ref 18–36)

## 2017-01-23 LAB — ANA: Anti Nuclear Antibody(ANA): NEGATIVE

## 2017-01-23 LAB — ANTI-SMOOTH MUSCLE ANTIBODY, IGG: Smooth Muscle Ab: <20 U (ref <20)

## 2017-01-24 LAB — TISSUE TRANSGLUTAMINASE, IGA: Tissue Transglutaminase Ab, IgA: 1 U/mL (ref <4)

## 2017-01-24 LAB — MITOCHONDRIAL ANTIBODIES: Mitochondrial M2 Ab, IgG: <=20 Units (ref <=20.0)

## 2017-01-25 ENCOUNTER — Encounter: Payer: Self-pay | Admitting: Gastroenterology

## 2017-01-25 ENCOUNTER — Ambulatory Visit (HOSPITAL_COMMUNITY)
Admission: RE | Admit: 2017-01-25 | Discharge: 2017-01-25 | Disposition: A | Discharge status: Home / Self Care | Payer: Managed Care, Other (non HMO) | Source: Ambulatory Visit | Attending: Gastroenterology | Admitting: Gastroenterology

## 2017-01-25 DIAGNOSIS — R1084 Generalized abdominal pain: Secondary | ICD-10-CM | POA: Insufficient documentation

## 2017-01-25 DIAGNOSIS — R194 Change in bowel habit: Secondary | ICD-10-CM | POA: Insufficient documentation

## 2017-01-25 DIAGNOSIS — K625 Hemorrhage of anus and rectum: Secondary | ICD-10-CM | POA: Insufficient documentation

## 2017-01-25 DIAGNOSIS — R7989 Other specified abnormal findings of blood chemistry: Secondary | ICD-10-CM | POA: Diagnosis not present

## 2017-01-25 DIAGNOSIS — R945 Abnormal results of liver function studies: Secondary | ICD-10-CM

## 2017-01-25 DIAGNOSIS — K760 Fatty (change of) liver, not elsewhere classified: Secondary | ICD-10-CM | POA: Insufficient documentation

## 2017-01-26 NOTE — Progress Notes (Signed)
Consultation note reviewed. Multiple issues. Agree with initial assessment and plans

## 2017-01-30 ENCOUNTER — Ambulatory Visit (AMBULATORY_SURGERY_CENTER): Payer: Managed Care, Other (non HMO) | Admitting: Internal Medicine

## 2017-01-30 ENCOUNTER — Encounter: Payer: Self-pay | Admitting: Internal Medicine

## 2017-01-30 VITALS — BP 114/64 | HR 73 | Temp 98.7°F | Resp 13 | Ht 68.0 in | Wt 200.0 lb

## 2017-01-30 DIAGNOSIS — K625 Hemorrhage of anus and rectum: Secondary | ICD-10-CM

## 2017-01-30 DIAGNOSIS — R194 Change in bowel habit: Secondary | ICD-10-CM

## 2017-01-30 MED ORDER — SODIUM CHLORIDE 0.9 % IV SOLN: 500.0000 mL | INTRAVENOUS | Status: DC

## 2017-01-30 NOTE — Patient Instructions (Signed)
YOU HAD AN ENDOSCOPIC PROCEDURE TODAY AT THE  ENDOSCOPY CENTER:   Refer to the procedure report that was given to you for any specific questions about what was found during the examination.  If the procedure report does not answer your questions, please call your gastroenterologist to clarify.  If you requested that your care partner not be given the details of your procedure findings, then the procedure report has been included in a sealed envelope for you to review at your convenience later.  YOU SHOULD EXPECT: Some feelings of bloating in the abdomen. Passage of more gas than usual.  Walking can help get rid of the air that was put into your GI tract during the procedure and reduce the bloating. If you had a lower endoscopy (such as a colonoscopy or flexible sigmoidoscopy) you may notice spotting of blood in your stool or on the toilet paper. If you underwent a bowel prep for your procedure, you may not have a normal bowel movement for a few days.  Please Note:  You might notice some irritation and congestion in your nose or some drainage.  This is from the oxygen used during your procedure.  There is no need for concern and it should clear up in a day or so.  SYMPTOMS TO REPORT IMMEDIATELY:   Following lower endoscopy (colonoscopy or flexible sigmoidoscopy):  Excessive amounts of blood in the stool  Significant tenderness or worsening of abdominal pains  Swelling of the abdomen that is new, acute  Fever of 100F or higher   For urgent or emergent issues, a gastroenterologist can be reached at any hour by calling [phone].   DIET:  We do recommend a small meal at first, but then you may proceed to your regular diet.  Drink plenty of fluids but you should avoid alcoholic beverages for 24 hours.  ACTIVITY:  You should plan to take it easy for the rest of today and you should NOT DRIVE or use heavy machinery until tomorrow (because of the sedation medicines used during the test).     FOLLOW UP: Our staff will call the number listed on your records the next business day following your procedure to check on you and address any questions or concerns that you may have regarding the information given to you following your procedure. If we do not reach you, we will leave a message.  However, if you are feeling well and you are not experiencing any problems, there is no need to return our call.  We will assume that you have returned to your regular daily activities without incident.  If any biopsies were taken you will be contacted by phone or by letter within the next 1-3 weeks.  Please call us at [phone] if you have not heard about the biopsies in 3 weeks.    SIGNATURES/CONFIDENTIALITY: You and/or your care partner have signed paperwork which will be entered into your electronic medical record.  These signatures attest to the fact that that the information above on your After Visit Summary has been reviewed and is understood.  Full responsibility of the confidentiality of this discharge information lies with you and/or your care-partner.    Handouts were given to your care partner on hemorrhoids and irritable bowel syndrome. You may resume your current medications today. Recommend metamucil 2 tablespoons daily to assist with irregular bowel habits. Exercise and weight loss for fatty liver. Routine office follow-up  With Dr. Marina GoodellPerry in 3 months. Please call if any questions  or concerns.   

## 2017-01-30 NOTE — Progress Notes (Signed)
No problems noted in the recovery room. maw 

## 2017-01-30 NOTE — Op Note (Signed)
[facility] Patient Name: Mark Maddox Procedure Date: 01/30/2017 3:32 PM MRN: [id number] Endoscopist: Wilhemina Bonito. Marina Goodell , MD Age: 36 Referring MD:  Date of Birth: Jan 09, 1981 Gender: Male Account #: [id number] Procedure:                Colonoscopy Indications:              Abdominal pain in the left lower quadrant, Rectal                            bleeding, Change in bowel habits Medicines:                Monitored Anesthesia Care Procedure:                Pre-Anesthesia Assessment:                           - Prior to the procedure, a History and Physical                            was performed, and patient medications and                            allergies were reviewed. The patient's tolerance of                            previous anesthesia was also reviewed. The risks                            and benefits of the procedure and the sedation                            options and risks were discussed with the patient.                            All questions were answered, and informed consent                            was obtained. Prior Anticoagulants: The patient has                            taken no previous anticoagulant or antiplatelet                            agents. ASA Grade Assessment: II - A patient with                            mild systemic disease. After reviewing the risks                            and benefits, the patient was deemed in                            satisfactory condition to undergo the procedure.  After obtaining informed consent, the colonoscope                            was passed under direct vision. Throughout the                            procedure, the patient's blood pressure, pulse, and                            oxygen saturations were monitored continuously. The                            Colonoscope was introduced through the anus and                            advanced to the the cecum,  identified by                            appendiceal orifice and ileocecal valve. The                            terminal ileum, ileocecal valve, appendiceal                            orifice, and rectum were photographed. The quality                            of the bowel preparation was excellent. The                            colonoscopy was performed without difficulty. The                            patient tolerated the procedure well. The bowel                            preparation used was SUPREP. Scope In: 3:44:12 PM Scope Out: 3:51:55 PM Scope Withdrawal Time: 0 hours 6 minutes 18 seconds  Total Procedure Duration: 0 hours 7 minutes 43 seconds  Findings:                 The terminal ileum appeared normal.                           Internal hemorrhoids were found during                            retroflexion. The hemorrhoids were small.                           The exam was otherwise without abnormality on                            direct and retroflexion views. Complications:            No immediate complications. Estimated  blood loss:                            None. Estimated Blood Loss:     Estimated blood loss: none. Impression:               - The examined portion of the ileum was normal.                           - Internal hemorrhoids.                           - The examination was otherwise normal on direct                            and retroflexion views.                           - Suspect irritable bowel syndrome. Recommendation:           - Repeat colonoscopy in 10 years for screening                            purposes.                           - Patient has a contact number available for                            emergencies. The signs and symptoms of potential                            delayed complications were discussed with the                            patient. Return to normal activities tomorrow.                            Written discharge  instructions were provided to the                            patient.                           - Resume previous diet.                           - Recommend Metamucil 2 tablespoons daily to assist                            with irregular bowel habits                           - Exercise and weight loss for fatty liver                           - Routine office follow-up with Dr. Marina GoodellPerry in 3  months. Wilhemina Bonito. Marina Goodell, MD 01/30/2017 3:57:05 PM This report has been signed electronically.

## 2017-01-30 NOTE — Progress Notes (Signed)
Pt's states no medical or surgical changes since previsit or office visit. 

## 2017-01-30 NOTE — Progress Notes (Signed)
Report to PACU, RN, vss, BBS= Clear.  

## 2017-01-31 ENCOUNTER — Telehealth: Payer: Self-pay

## 2017-01-31 NOTE — Telephone Encounter (Signed)
  Follow up Call-  Call back number 01/30/2017  Post procedure Call Back phone  # [phone]  Permission to leave phone message Yes  Some recent data might be hidden     Patient questions:  Do you have a fever, pain , or abdominal swelling? No. Pain Score  0 *  Have you tolerated food without any problems? Yes.    Have you been able to return to your normal activities? Yes.    Do you have any questions about your discharge instructions: Diet   No. Medications  No. Follow up visit  No.  Do you have questions or concerns about your Care? No.  Actions: * If pain score is 4 or above: No action needed, pain <4.  No problems noted per pt.  Pt thanked us for "great care".  maw

## 2017-03-13 ENCOUNTER — Other Ambulatory Visit: Payer: Self-pay | Admitting: *Deleted

## 2017-03-13 MED ORDER — TAMSULOSIN HCL 0.4 MG PO CAPS: 0.4000 mg | ORAL_CAPSULE | Freq: Every day | ORAL | 0 refills | Status: DC

## 2017-03-16 ENCOUNTER — Ambulatory Visit: Payer: Managed Care, Other (non HMO) | Admitting: Family Medicine

## 2017-03-27 ENCOUNTER — Encounter: Payer: Self-pay | Admitting: Family Medicine

## 2017-03-27 ENCOUNTER — Ambulatory Visit (INDEPENDENT_AMBULATORY_CARE_PROVIDER_SITE_OTHER): Payer: 59 | Admitting: Family Medicine

## 2017-03-27 VITALS — BP 129/83 | HR 87 | Temp 97.2°F | Ht 68.0 in | Wt 192.2 lb

## 2017-03-27 DIAGNOSIS — R945 Abnormal results of liver function studies: Secondary | ICD-10-CM

## 2017-03-27 DIAGNOSIS — N401 Enlarged prostate with lower urinary tract symptoms: Secondary | ICD-10-CM

## 2017-03-27 DIAGNOSIS — K589 Irritable bowel syndrome without diarrhea: Secondary | ICD-10-CM | POA: Insufficient documentation

## 2017-03-27 DIAGNOSIS — R7989 Other specified abnormal findings of blood chemistry: Secondary | ICD-10-CM | POA: Diagnosis not present

## 2017-03-27 DIAGNOSIS — N400 Enlarged prostate without lower urinary tract symptoms: Secondary | ICD-10-CM | POA: Insufficient documentation

## 2017-03-27 MED ORDER — TAMSULOSIN HCL 0.4 MG PO CAPS: 0.8000 mg | ORAL_CAPSULE | Freq: Every day | ORAL | 3 refills | Status: DC

## 2017-03-27 NOTE — Progress Notes (Signed)
   HPI  Patient presents today here for follow-up of BPH and irritable bowel syndrome.  BPH Flomax has helped, however it's causing retrograde ejaculation. Patient is comfortable and understands the diagnosis. He actually has improved symptoms with 0.8 mg would like to increase the dose.  IBS Doing better with cleaner diet and regular exercise.   PMH: Smoking status noted ROS: Per HPI  Objective: BP 129/83   Pulse 87   Temp (!) 97.2 F (36.2 C) (Oral)   Ht 5\' 8"  (1.727 m)   Wt 192 lb 3.2 oz (87.2 kg)   BMI 29.22 kg/m  Gen: NAD, alert, cooperative with exam HEENT: NCAT CV: RRR, good S1/S2, no murmur Resp: CTABL, no wheezes, non-labored Ext: No edema, warm Neuro: Alert and oriented, No gross deficits  Assessment and plan:  # BPH Improved with Flomax, increased to 0.8 mg, offered Rapaflo which appears to be covered by his insurance, however he declines at this time  # Irritable bowel syndrome Doing better with diet changes and exercise Congratulated on positive lifestyle changes  Elevated LFTs Has had work up from GI, likely Fatty liver Rcheck in 1 year   Meds ordered this encounter  Medications  . tamsulosin (FLOMAX) 0.4 MG CAPS capsule    Sig: Take 2 capsules (0.8 mg total) by mouth daily.    Dispense:  180 capsule    Refill:  3    Murtis SinkSam Adahlia Stembridge, MD [facility] 03/27/2017, 8:44 AM

## 2017-03-27 NOTE — Patient Instructions (Signed)
Great to see you!  Come back in 1 year unless you need us sooner.    

## 2017-04-13 ENCOUNTER — Encounter: Payer: Self-pay | Admitting: Internal Medicine

## 2017-06-12 ENCOUNTER — Other Ambulatory Visit: Payer: Self-pay | Admitting: Family Medicine

## 2017-11-08 ENCOUNTER — Encounter: Payer: Self-pay | Admitting: Family Medicine

## 2017-11-08 ENCOUNTER — Ambulatory Visit (INDEPENDENT_AMBULATORY_CARE_PROVIDER_SITE_OTHER): Payer: BLUE CROSS/BLUE SHIELD | Admitting: Family Medicine

## 2017-11-08 VITALS — BP 152/97 | HR 90 | Temp 97.4°F | Ht 68.0 in | Wt 199.0 lb

## 2017-11-08 DIAGNOSIS — J9801 Acute bronchospasm: Secondary | ICD-10-CM | POA: Diagnosis not present

## 2017-11-08 DIAGNOSIS — J029 Acute pharyngitis, unspecified: Secondary | ICD-10-CM | POA: Diagnosis not present

## 2017-11-08 DIAGNOSIS — R030 Elevated blood-pressure reading, without diagnosis of hypertension: Secondary | ICD-10-CM | POA: Diagnosis not present

## 2017-11-08 DIAGNOSIS — R6889 Other general symptoms and signs: Secondary | ICD-10-CM

## 2017-11-08 LAB — VERITOR FLU A/B WAIVED
Influenza A: NEGATIVE
Influenza B: NEGATIVE

## 2017-11-08 LAB — RAPID STREP SCREEN (MED CTR MEBANE ONLY): Strep Gp A Ag, IA W/Reflex: NEGATIVE

## 2017-11-08 LAB — CULTURE, GROUP A STREP

## 2017-11-08 MED ORDER — METHYLPREDNISOLONE ACETATE 80 MG/ML IJ SUSP
80.0000 mg | Freq: Once | INTRAMUSCULAR | Status: AC
  Administered 2017-11-08: 80 mg via INTRAMUSCULAR

## 2017-11-08 MED ORDER — GUAIFENESIN-CODEINE 100-10 MG/5ML PO SOLN: 5.0000 mL | Freq: Four times a day (QID) | ORAL | 0 refills | Status: DC | PRN

## 2017-11-08 NOTE — Patient Instructions (Addendum)
Follow-up in 2-4 weeks for recheck of your blood pressure.  We may consider ordering a sleep study to evaluate you for sleep apnea since you have had elevated blood pressures in the past despite blood pressure medications.  Your strep test was negative.  Your flu test was negative.  I have prescribed you a cough medication to take at bedtime.  Make sure that you have at least 8 hours prior to driving before taking this medication as it can cause sedation.  It has a chest decongestant in it.  You may use Mucinex or the product below when you are not using the cough syrup if you continue to have chest congestion.  It appears that you have a viral upper respiratory infection (cold).  Cold symptoms can last up to 2 weeks.  I recommend that you only use cold medications that are safe in high blood pressure like Coricidin (generic is fine).  Other cold medications can increase your blood pressure.    - Get plenty of rest and drink plenty of fluids. - Try to breathe moist air. Use a cold mist humidifier. - Consume warm fluids (soup or tea) to provide relief for a stuffy nose and to loosen phlegm. - For nasal stuffiness, try saline nasal spray or a Neti Pot.  Afrin nasal spray can also be used but this product should not be used longer than 3 days or it will cause rebound nasal stuffiness (worsening nasal congestion). - For sore throat pain relief: suck on throat lozenges, hard candy or popsicles; gargle with warm salt water (1/4 tsp. salt per 8 oz. of water); and eat soft, bland foods. - Eat a well-balanced diet. If you cannot, ensure you are getting enough nutrients by taking a daily multivitamin. - Avoid dairy products, as they can thicken phlegm. - Avoid alcohol, as it impairs your body's immune system.  CONTACT YOUR DOCTOR IF YOU EXPERIENCE ANY OF THE FOLLOWING: - High fever - Ear pain - Sinus-type headache - Unusually severe cold symptoms - Cough that gets worse while other cold symptoms  improve - Flare up of any chronic lung problem, such as asthma - Your symptoms persist longer than 2 weeks

## 2017-11-08 NOTE — Progress Notes (Signed)
Subjective: CC: sore throat, SOB PCP: Elenora Gamma, MD WUJ:Mark Maddox is a 37 y.o. male presenting to clinic today for:  1. Sore throat Patient notes that he developed sore throat, congestion, fever and shortness of breath on exertion 3 days ago.  He notes associated wheezing.  He reports that he woke up last night coughing and short of breath.  Denies history of sleep apnea.  He does note that he has had uncontrolled blood pressures despite medications in the past but is never had a sleep study.  He reports a past medical history significant for childhood asthma.  Has not on any inhalers now.  Denies nausea, vomiting, diarrhea.  He has been using Afrin nasal spray for nasal congestion intermittently but no interventions at this time.   ROS: Per HPI  No Known Allergies Past Medical History:  Diagnosis Date  . Asthma    had asthma as a child  . Enlarged prostate   . Hypertension    hx of   . Inguinal hernia bilateral, non-recurrent    No current outpatient medications on file.  Current Facility-Administered Medications:  .  0.9 %  sodium chloride infusion, 500 mL, Intravenous, Continuous, Hilarie Fredrickson, MD Social History   Socioeconomic History  . Marital status: Single    Spouse name: Not on file  . Number of children: 0  . Years of education: Not on file  . Highest education level: Not on file  Social Needs  . Financial resource strain: Not on file  . Food insecurity - worry: Not on file  . Food insecurity - inability: Not on file  . Transportation needs - medical: Not on file  . Transportation needs - non-medical: Not on file  Occupational History  . Occupation: truck Hospital doctor  Tobacco Use  . Smoking status: Never Smoker  . Smokeless tobacco: Never Used  Substance and Sexual Activity  . Alcohol use: No  . Drug use: No  . Sexual activity: Not on file  Other Topics Concern  . Not on file  Social History Narrative  . Not on file   Family History    Problem Relation Age of Onset  . Diabetes Maternal Grandmother   . Heart disease Maternal Grandfather        heart attack  . Diabetes Maternal Grandfather   . Prostate cancer Paternal Grandfather     Objective: Office vital signs reviewed. BP (!) 152/97   Pulse 90   Temp (!) 97.4 F (36.3 C) (Oral)   Ht 5\' 8"  (1.727 m)   Wt 199 lb (90.3 kg)   SpO2 96%   BMI 30.26 kg/m   Physical Examination:  General: Awake, alert, well nourished, nontoxic appearing, No acute distress HEENT: Normal    Neck: No masses palpated. No lymphadenopathy    Ears: Tympanic membranes intact, normal light reflex, no erythema, no bulging    Eyes: PERRLA, extraocular membranes intact, sclera white    Nose: nasal turbinates moist, clear nasal discharge    Throat: moist mucus membranes, mild oropharyngeal erythema, no tonsillar exudate.  Airway is patent Cardio: regular rate and rhythm, S1S2 heard, no murmurs appreciated Pulm: clear to auscultation bilaterally, no wheezes, rhonchi or rales; normal work of breathing on room air  Assessment/ Plan: 37 y.o. male   1. Sore throat Patient is afebrile and nontoxic-appearing on exam.  He has normal pulse ox on room air.  His physical exam was remarkable for mild oropharyngeal erythema.  Cardiopulmonary exam unremarkable.  I suspect that he has a viral URI.  Rapid strep and rapid flu were both negative.  Home care instructions were reviewed with the patient.  I have prescribed him Robitussin-AC to use at nighttime for severe cough.  He may use Mucinex during the day for chest congestion.  May continue Afrin for up to 3 days for nasal congestion.  Handout provided with instructions for safe medications in the setting of elevated blood pressures.  He was also given a dose of Depo-Medrol IM 80 mg in office for what appears to be bronchospasm.  He will return in 2 weeks for blood pressure recheck. - Rapid St[facility] Screen ([facility])  2. Flu-like symptoms - Veritor Flu A/B  Waived  3. Elevated blood pressure reading without diagnosis of hypertension  4. Bronchospasm - methylPREDNISolone acetate (DEPO-MEDROL) injection 80 mg   Orders Placed This Encounter  Procedures  . Rapid St[facility] Screen (Not [facility])  . Veritor Flu A/B Waived    Order Specific Question:   Source    Answer:   nasal   Meds ordered this encounter  Medications  . guaiFENesin-codeine 100-10 MG/5ML syrup    Sig: Take 5 mLs by mouth every 6 (six) hours as needed for cough.    Dispense:  120 mL    Refill:  0  . methylPREDNISolone acetate (DEPO-MEDROL) injection 80 mg     Raliegh IpAshly M Ramesha Poster, DO [facility] [phone]

## 2017-11-13 ENCOUNTER — Telehealth: Payer: Self-pay | Admitting: Family Medicine

## 2017-11-13 MED ORDER — POLYMYXIN B-TRIMETHOPRIM 10000-0.1 UNIT/ML-% OP SOLN: 1.0000 [drp] | Freq: Four times a day (QID) | OPHTHALMIC | 0 refills | Status: DC

## 2017-11-13 NOTE — Telephone Encounter (Signed)
Rx sent to [facility] to cover emperically for Pink eye as he is out of town.   Murtis SinkSam Jillienne Egner, MD [facility] 11/13/2017, 11:59 AM

## 2017-11-13 NOTE — Telephone Encounter (Signed)
Pt c/o pink eye, and was just seen last week want something called. Offered pt appt, but he is out of town he wants something called in. Please advise.

## 2017-11-13 NOTE — Telephone Encounter (Signed)
Please review and advise. Please route to pool A 

## 2017-11-13 NOTE — Telephone Encounter (Signed)
Patient aware- rx sent to [facility] as requested

## 2017-11-13 NOTE — Addendum Note (Signed)
Addended by: Lorelee CoverOSTOSKY, Dung Salinger C on: 11/13/2017 01:10 PM   Modules accepted: Orders

## 2017-11-22 NOTE — Progress Notes (Signed)
Subjective: CC: f/u BP PCP: Timmothy Euler, MD WNI:OEVOJ D Grieger is a 37 y.o. male presenting to clinic today for:  1. Elevated Blood pressure without diagnosis of HTN Patient was seen 2 weeks ago for an upper respiratory infection.  He was noted to have an elevated blood pressure during that visit.  He returns today for blood pressure recheck.  He notes that his blood pressures have typically been running 150s over 100s.  He reports that he is on a blood pressure medication previously that was titrated up but this never really brought his blood pressure down.  He denies visual disturbance, headache, chest pain, shortness of breath, lower extremity edema.  2.  Elevated liver function tests/ Fatty liver/ Overweight Patient reports that he is actually had this worked up by GI last year and was told that he had a fatty liver.  He is not currently on a statin.  He does desire weight loss and wishes to pursue this potentially surgically.  He would like a consultation with a Psychologist, sport and exercise.  Denies abdominal pain except for when he has to have a significant bowel movement.  No nausea, vomiting.  No unplanned weight loss.  No jaundice.  No alcohol intake.   ROS: Per HPI  No Known Allergies Past Medical History:  Diagnosis Date  . Asthma    had asthma as a child  . Enlarged prostate   . Hypertension    hx of   . Inguinal hernia bilateral, non-recurrent     Current Outpatient Medications:  .  guaiFENesin-codeine 100-10 MG/5ML syrup, Take 5 mLs by mouth every 6 (six) hours as needed for cough., Disp: 120 mL, Rfl: 0 .  trimethoprim-polymyxin b (POLYTRIM) ophthalmic solution, Place 1 drop into both eyes every 6 (six) hours., Disp: 10 mL, Rfl: 0  Current Facility-Administered Medications:  .  0.9 %  sodium chloride infusion, 500 mL, Intravenous, Continuous, Irene Shipper, MD Social History   Socioeconomic History  . Marital status: Single    Spouse name: Not on file  . Number of children: 0    . Years of education: Not on file  . Highest education level: Not on file  Occupational History  . Occupation: truck Animator Needs  . Financial resource strain: Not on file  . Food insecurity:    Worry: Not on file    Inability: Not on file  . Transportation needs:    Medical: Not on file    Non-medical: Not on file  Tobacco Use  . Smoking status: Never Smoker  . Smokeless tobacco: Never Used  Substance and Sexual Activity  . Alcohol use: No  . Drug use: No  . Sexual activity: Not on file  Lifestyle  . Physical activity:    Days per week: Not on file    Minutes per session: Not on file  . Stress: Not on file  Relationships  . Social connections:    Talks on phone: Not on file    Gets together: Not on file    Attends religious service: Not on file    Active member of club or organization: Not on file    Attends meetings of clubs or organizations: Not on file    Relationship status: Not on file  . Intimate partner violence:    Fear of current or ex partner: Not on file    Emotionally abused: Not on file    Physically abused: Not on file    Forced sexual activity:  Not on file  Other Topics Concern  . Not on file  Social History Narrative  . Not on file   Family History  Problem Relation Age of Onset  . Diabetes Maternal Grandmother   . Heart disease Maternal Grandfather        heart attack  . Diabetes Maternal Grandfather   . Prostate cancer Paternal Grandfather     Objective: Office vital signs reviewed. BP 134/86   Pulse 83   Temp 97.8 F (36.6 C) (Oral)   Ht 5' 8"  (1.727 m)   Wt 194 lb (88 kg)   BMI 29.50 kg/m   Physical Examination:  General: Awake, alert, well nourished, well appearing, No acute distress HEENT: Normal    Eyes: extraocular membranes intact, sclera white    Throat: moist mucus membranes Cardio: regular rate and rhythm, S1S2 heard, no murmurs appreciated Pulm: clear to auscultation bilaterally, no wheezes, rhonchi or rales;  normal work of breathing on room air GI: soft, non-tender, non-distended, bowel sounds present x4, no hepatomegaly, no splenomegaly, no masses MSK: normal gait and normal station Skin: dry; intact; no rashes or lesions; no jaundice  Assessment/ Plan: 37 y.o. male   Essential hypertension Stage 1.  Previously failed Cozaar.  Start HCTZ 53m daily.  Monitor BP daily and record.  Instructions provided.  CMP, Lipid today.  Follow up in 2 weeks for BP check.  Elevated LFTs Evaluated by gastroenterology in the past and was told that he had fatty liver disease.  He has not followed up since.  Not currently on a statin.  Does desire weight loss see below.  Check CMP and lipid panel today.  Will likely need to initiate statin.  Overweight with body mass index (BMI) 25.0-29.9 Wishes to see a bariatric surgeon for consideration of gastric sleeve.  Referral placed.  Comorbidities include stage I hypertension and fatty liver disease.  DASH diet handout provided.  NAFLD (nonalcoholic fatty liver disease) Noted on 01/2017 liver ultrasound.  Not currently on a statin.  Desires to pursue surgical intervention for weight loss.  Check CMP and lipid panel today.   Orders Placed This Encounter  Procedures  . Lipid Panel  . CMP14+EGFR  . Ambulatory referral to General Surgery    Referral Priority:   Routine    Referral Type:   Surgical    Referral Reason:   Specialty Services Required    Requested Specialty:   General Surgery    Number of Visits Requested:   1   Meds ordered this encounter  Medications  . hydrochlorothiazide (HYDRODIURIL) 25 MG tablet    Sig: Take 1 tablet (25 mg total) by mouth daily.    Dispense:  30 tablet    Refill:  0[place] DO [phone]

## 2017-11-24 ENCOUNTER — Encounter: Payer: Self-pay | Admitting: Family Medicine

## 2017-11-24 ENCOUNTER — Ambulatory Visit (INDEPENDENT_AMBULATORY_CARE_PROVIDER_SITE_OTHER): Payer: BLUE CROSS/BLUE SHIELD | Admitting: Family Medicine

## 2017-11-24 VITALS — BP 134/86 | HR 83 | Temp 97.8°F | Ht 68.0 in | Wt 194.0 lb

## 2017-11-24 DIAGNOSIS — Z23 Encounter for immunization: Secondary | ICD-10-CM | POA: Diagnosis not present

## 2017-11-24 DIAGNOSIS — K760 Fatty (change of) liver, not elsewhere classified: Secondary | ICD-10-CM | POA: Insufficient documentation

## 2017-11-24 DIAGNOSIS — I10 Essential (primary) hypertension: Secondary | ICD-10-CM

## 2017-11-24 DIAGNOSIS — R945 Abnormal results of liver function studies: Secondary | ICD-10-CM | POA: Diagnosis not present

## 2017-11-24 DIAGNOSIS — E1159 Type 2 diabetes mellitus with other circulatory complications: Secondary | ICD-10-CM | POA: Insufficient documentation

## 2017-11-24 DIAGNOSIS — R7989 Other specified abnormal findings of blood chemistry: Secondary | ICD-10-CM

## 2017-11-24 DIAGNOSIS — E663 Overweight: Secondary | ICD-10-CM | POA: Insufficient documentation

## 2017-11-24 MED ORDER — HYDROCHLOROTHIAZIDE 25 MG PO TABS: 25.0000 mg | ORAL_TABLET | Freq: Every day | ORAL | 0 refills | Status: DC

## 2017-11-24 NOTE — Assessment & Plan Note (Addendum)
Wishes to see a bariatric surgeon for consideration of gastric sleeve.  Referral placed.  Comorbidities include stage I hypertension and fatty liver disease.  DASH diet handout provided.

## 2017-11-24 NOTE — Addendum Note (Signed)
Addended byDory Peru: Marteze Vecchio C on: 11/24/2017 01:24 PM   Modules accepted: Orders

## 2017-11-24 NOTE — Patient Instructions (Addendum)
Monitor your blood pressure daily.  Record this and follow up in 2 weeks for recheck.  You had labs performed today.  You will be contacted with the results of the labs once they are available, usually in the next 3 days for routine lab work.   How to Take Your Blood Pressure You can take your blood pressure at home with a machine. You may need to check your blood pressure at home:  To check if you have high blood pressure (hypertension).  To check your blood pressure over time.  To make sure your blood pressure medicine is working.  Supplies needed: You will need a blood pressure machine, or monitor. You can buy one at a drugstore or online. When choosing one:  Choose one with an arm cuff.  Choose one that wraps around your upper arm. Only one finger should fit between your arm and the cuff.  Do not choose one that measures your blood pressure from your wrist or finger.  Your doctor can suggest a monitor. How to prepare Avoid these things for 30 minutes before checking your blood pressure:  Drinking caffeine.  Drinking alcohol.  Eating.  Smoking.  Exercising.  Five minutes before checking your blood pressure:  Pee.  Sit in a dining chair. Avoid sitting in a soft couch or armchair.  Be quiet. Do not talk.  How to take your blood pressure Follow the instructions that came with your machine. If you have a digital blood pressure monitor, these may be the instructions: 1. Sit up straight. 2. Place your feet on the floor. Do not cross your ankles or legs. 3. Rest your left arm at the level of your heart. You may rest it on a table, desk, or chair. 4. Pull up your shirt sleeve. 5. Wrap the blood pressure cuff around the upper part of your left arm. The cuff should be 1 inch (2.5 cm) above your elbow. It is best to wrap the cuff around bare skin. 6. Fit the cuff snugly around your arm. You should be able to place only one finger between the cuff and your arm. 7. Put the  cord inside the groove of your elbow. 8. Press the power button. 9. Sit quietly while the cuff fills with air and loses air. 10. Write down the numbers on the screen. 11. Wait 2-3 minutes and then repeat steps 1-10.  What do the numbers mean? Two numbers make up your blood pressure. The first number is called systolic pressure. The second is called diastolic pressure. An example of a blood pressure reading is "120 over 80" (or 120/80). If you are an adult and do not have a medical condition, use this guide to find out if your blood pressure is normal: Normal  First number: below 120.  Second number: below 80. Elevated  First number: 120-129.  Second number: below 80. Hypertension stage 1  First number: 130-139.  Second number: 80-89. Hypertension stage 2  First number: 140 or above.  Second number: 90 or above. Your blood pressure is above normal even if only the top or bottom number is above normal. Follow these instructions at home:  Check your blood pressure as often as your doctor tells you to.  Take your monitor to your next doctor's appointment. Your doctor will: ? Make sure you are using it correctly. ? Make sure it is working right.  Make sure you understand what your blood pressure numbers should be.  Tell your doctor if your medicines  are causing side effects. Contact a doctor if:  Your blood pressure keeps being high. Get help right away if:  Your first blood pressure number is higher than 180.  Your second blood pressure number is higher than 120. This information is not intended to replace advice given to you by your health care provider. Make sure you discuss any questions you have with your health care provider. Document Released: 07/21/2008 Document Revised: 07/06/2016 Document Reviewed: 01/15/2016 Elsevier Interactive Patient Education  2018 [employer].   DASH Eating Plan DASH stands for "Dietary Approaches to Stop Hypertension." The DASH  eating plan is a healthy eating plan that has been shown to reduce high blood pressure (hypertension). It may also reduce your risk for type 2 diabetes, heart disease, and stroke. The DASH eating plan may also help with weight loss. What are tips for following this plan? General guidelines  Avoid eating more than 2,300 mg (milligrams) of salt (sodium) a day. If you have hypertension, you may need to reduce your sodium intake to 1,500 mg a day.  Limit alcohol intake to no more than 1 drink a day for nonpregnant women and 2 drinks a day for men. One drink equals 12 oz of beer, 5 oz of wine, or 1 oz of hard liquor.  Work with your health care provider to maintain a healthy body weight or to lose weight. Ask what an ideal weight is for you.  Get at least 30 minutes of exercise that causes your heart to beat faster (aerobic exercise) most days of the week. Activities may include walking, swimming, or biking.  Work with your health care provider or diet and nutrition specialist (dietitian) to adjust your eating plan to your individual calorie needs. Reading food labels  Check food labels for the amount of sodium per serving. Choose foods with less than 5 percent of the Daily Value of sodium. Generally, foods with less than 300 mg of sodium per serving fit into this eating plan.  To find whole grains, look for the word "whole" as the first word in the ingredient list. Shopping  Buy products labeled as "low-sodium" or "no salt added."  Buy fresh foods. Avoid canned foods and premade or frozen meals. Cooking  Avoid adding salt when cooking. Use salt-free seasonings or herbs instead of table salt or sea salt. Check with your health care provider or pharmacist before using salt substitutes.  Do not fry foods. Cook foods using healthy methods such as baking, boiling, grilling, and broiling instead.  Cook with heart-healthy oils, such as olive, canola, soybean, or sunflower oil. Meal  planning   Eat a balanced diet that includes: ? 5 or more servings of fruits and vegetables each day. At each meal, try to fill half of your plate with fruits and vegetables. ? Up to 6-8 servings of whole grains each day. ? Less than 6 oz of lean meat, poultry, or fish each day. A 3-oz serving of meat is about the same size as a deck of cards. One egg equals 1 oz. ? 2 servings of low-fat dairy each day. ? A serving of nuts, seeds, or beans 5 times each week. ? Heart-healthy fats. Healthy fats called Omega-3 fatty acids are found in foods such as flaxseeds and coldwater fish, like sardines, salmon, and mackerel.  Limit how much you eat of the following: ? Canned or prepackaged foods. ? Food that is high in trans fat, such as fried foods. ? Food that is high in saturated  fat, such as fatty meat. ? Sweets, desserts, sugary drinks, and other foods with added sugar. ? Full-fat dairy products.  Do not salt foods before eating.  Try to eat at least 2 vegetarian meals each week.  Eat more home-cooked food and less restaurant, buffet, and fast food.  When eating at a restaurant, ask that your food be prepared with less salt or no salt, if possible. What foods are recommended? The items listed may not be a complete list. Talk with your dietitian about what dietary choices are best for you. Grains Whole-grain or whole-wheat bread. Whole-grain or whole-wheat pasta. Brown rice. Orpah Cobb. Bulgur. Whole-grain and low-sodium cereals. Pita bread. Low-fat, low-sodium crackers. Whole-wheat flour tortillas. Vegetables Fresh or frozen vegetables (raw, steamed, roasted, or grilled). Low-sodium or reduced-sodium tomato and vegetable juice. Low-sodium or reduced-sodium tomato sauce and tomato paste. Low-sodium or reduced-sodium canned vegetables. Fruits All fresh, dried, or frozen fruit. Canned fruit in natural juice (without added sugar). Meat and other protein foods Skinless chicken or [place].  Ground chicken or [place]. Pork with fat trimmed off. Fish and seafood. Egg whites. Dried beans, peas, or lentils. Unsalted nuts, nut butters, and seeds. Unsalted canned beans. Lean cuts of beef with fat trimmed off. Low-sodium, lean deli meat. Dairy Low-fat (1%) or fat-free (skim) milk. Fat-free, low-fat, or reduced-fat cheeses. Nonfat, low-sodium ricotta or cottage cheese. Low-fat or nonfat yogurt. Low-fat, low-sodium cheese. Fats and oils Soft margarine without trans fats. Vegetable oil. Low-fat, reduced-fat, or light mayonnaise and salad dressings (reduced-sodium). Canola, safflower, olive, soybean, and sunflower oils. Avocado. Seasoning and other foods Herbs. Spices. Seasoning mixes without salt. Unsalted popcorn and pretzels. Fat-free sweets. What foods are not recommended? The items listed may not be a complete list. Talk with your dietitian about what dietary choices are best for you. Grains Baked goods made with fat, such as croissants, muffins, or some breads. Dry pasta or rice meal packs. Vegetables Creamed or fried vegetables. Vegetables in a cheese sauce. Regular canned vegetables (not low-sodium or reduced-sodium). Regular canned tomato sauce and paste (not low-sodium or reduced-sodium). Regular tomato and vegetable juice (not low-sodium or reduced-sodium). Rosita Fire. Olives. Fruits Canned fruit in a light or heavy syrup. Fried fruit. Fruit in cream or butter sauce. Meat and other protein foods Fatty cuts of meat. Ribs. Fried meat. Tomasa Blase. Sausage. Bologna and other processed lunch meats. Salami. Fatback. Hotdogs. Bratwurst. Salted nuts and seeds. Canned beans with added salt. Canned or smoked fish. Whole eggs or egg yolks. Chicken or [place] with skin. Dairy Whole or 2% milk, cream, and half-and-half. Whole or full-fat cream cheese. Whole-fat or sweetened yogurt. Full-fat cheese. Nondairy creamers. Whipped toppings. Processed cheese and cheese spreads. Fats and oils Butter. Stick  margarine. Lard. Shortening. Ghee. Bacon fat. Tropical oils, such as coconut, palm kernel, or palm oil. Seasoning and other foods Salted popcorn and pretzels. Onion salt, garlic salt, seasoned salt, table salt, and sea salt. Worcestershire sauce. Tartar sauce. Barbecue sauce. Teriyaki sauce. Soy sauce, including reduced-sodium. Steak sauce. Canned and packaged gravies. Fish sauce. Oyster sauce. Cocktail sauce. Horseradish that you find on the shelf. Ketchup. Mustard. Meat flavorings and tenderizers. Bouillon cubes. Hot sauce and Tabasco sauce. Premade or packaged marinades. Premade or packaged taco seasonings. Relishes. Regular salad dressings. Where to find more information:  National Heart, Lung, and Blood Institute: PopSteam.is  American Heart Association: www.heart.org Summary  The DASH eating plan is a healthy eating plan that has been shown to reduce high blood pressure (hypertension). It may also reduce  your risk for type 2 diabetes, heart disease, and stroke.  With the DASH eating plan, you should limit salt (sodium) intake to 2,300 mg a day. If you have hypertension, you may need to reduce your sodium intake to 1,500 mg a day.  When on the DASH eating plan, aim to eat more fresh fruits and vegetables, whole grains, lean proteins, low-fat dairy, and heart-healthy fats.  Work with your health care provider or diet and nutrition specialist (dietitian) to adjust your eating plan to your individual calorie needs. This information is not intended to replace advice given to you by your health care provider. Make sure you discuss any questions you have with your health care provider. Document Released: 07/28/2011 Document Revised: 08/01/2016 Document Reviewed: 08/01/2016 Elsevier Interactive Patient Education  [employer].

## 2017-11-24 NOTE — Assessment & Plan Note (Signed)
Noted on 01/2017 liver ultrasound.  Not currently on a statin.  Desires to pursue surgical intervention for weight loss.  Check CMP and lipid panel today.

## 2017-11-24 NOTE — Assessment & Plan Note (Signed)
Stage 1.  Previously failed Cozaar.  Start HCTZ 25mg  daily.  Monitor BP daily and record.  Instructions provided.  CMP, Lipid today.  Follow up in 2 weeks for BP check.

## 2017-11-24 NOTE — Assessment & Plan Note (Signed)
Evaluated by gastroenterology in the past and was told that he had fatty liver disease.  He has not followed up since.  Not currently on a statin.  Does desire weight loss see below.  Check CMP and lipid panel today.  Will likely need to initiate statin.

## 2017-11-25 LAB — LIPID PANEL
Chol/HDL Ratio: 4.3 ratio (ref 0.0–5.0)
Cholesterol, Total: 166 mg/dL (ref 100–199)
HDL: 39 mg/dL — ABNORMAL LOW (ref >39)
LDL Calculated: 89 mg/dL (ref 0–99)
Triglycerides: 189 mg/dL — ABNORMAL HIGH (ref 0–149)
VLDL Cholesterol Cal: 38 mg/dL (ref 5–40)

## 2017-11-25 LAB — CMP14+EGFR
ALT: 70 IU/L — ABNORMAL HIGH (ref 0–44)
AST: 68 IU/L — ABNORMAL HIGH (ref 0–40)
Albumin/Globulin Ratio: 1.3 (ref 1.2–2.2)
Albumin: 4.6 g/dL (ref 3.5–5.5)
Alkaline Phosphatase: 65 IU/L (ref 39–117)
BUN/Creatinine Ratio: 13 (ref 9–20)
BUN: 13 mg/dL (ref 6–20)
Bilirubin Total: 0.5 mg/dL (ref 0.0–1.2)
CO2: 25 mmol/L (ref 20–29)
Calcium: 9.9 mg/dL (ref 8.7–10.2)
Chloride: 99 mmol/L (ref 96–106)
Creatinine, Ser: 0.97 mg/dL (ref 0.76–1.27)
GFR calc Af Amer: 115 mL/min/{1.73_m2} (ref >59)
GFR calc non Af Amer: 99 mL/min/{1.73_m2} (ref >59)
Globulin, Total: 3.5 g/dL (ref 1.5–4.5)
Glucose: 79 mg/dL (ref 65–99)
Potassium: 4.6 mmol/L (ref 3.5–5.2)
Sodium: 139 mmol/L (ref 134–144)
Total Protein: 8.1 g/dL (ref 6.0–8.5)

## 2017-12-12 NOTE — Progress Notes (Signed)
Subjective: CC: HTN PCP: Elenora GammaBradshaw, Samuel L, MD ZOX:WRUEAHPI:Mark Maddox is a 37 y.o. male presenting to clinic today for:  1. Hypertension Patient reports compliance with HCTZ 25mg , which was started at visit 2 weeks ago. Side effects: increased urination but he notes that nocturia has improved. Denies headache, dizziness, visual changes, nausea, vomiting, substernal chest pain, LE swelling, abdominal pain or shortness of breath.  2. Overweight Patient would like more information about medical weight loss.  He would like to see someone in [place] if possible.  He notes that he is fairly physically active but is having difficulty losing weight.  ROS: Per HPI  No Known Allergies Past Medical History:  Diagnosis Date  . Asthma    had asthma as a child  . Enlarged prostate   . Hypertension    hx of   . Inguinal hernia bilateral, non-recurrent     Current Outpatient Medications:  .  hydrochlorothiazide (HYDRODIURIL) 25 MG tablet, Take 1 tablet (25 mg total) by mouth daily., Disp: 30 tablet, Rfl: 0  Current Facility-Administered Medications:  .  0.9 %  sodium chloride infusion, 500 mL, Intravenous, Continuous, Hilarie FredricksonPerry, John N, MD Social History   Socioeconomic History  . Marital status: Single    Spouse name: Not on file  . Number of children: 0  . Years of education: Not on file  . Highest education level: Not on file  Occupational History  . 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 Financial resource strain: Not on file  . Food insecurity:    Worry: Not on file    Inability: Not on file  . Transportation needs:    Medical: Not on file    Non-medical: Not on file  Tobacco Use  . Smoking status: Never Smoker  . Smokeless tobacco: Never Used  Substance and Sexual Activity  . Alcohol use: No  . Drug use: No  . Sexual activity: Not on file  Lifestyle  . Physical activity:    Days per week: Not on file    Minutes per session: Not on file  . Stress: Not on file    Relationships  . Social connections:    Talks on phone: Not on file    Gets together: Not on file    Attends religious service: Not on file    Active member of club or organization: Not on file    Attends meetings of clubs or organizations: Not on file    Relationship status: Not on file  . Intimate partner violence:    Fear of current or ex partner: Not on file    Emotionally abused: Not on file    Physically abused: Not on file    Forced sexual activity: Not on file  Other Topics Concern  . Not on file  Social History Narrative  . Not on file   Family History  Problem Relation Age of Onset  . Diabetes Maternal Grandmother   . Heart disease Maternal Grandfather        heart attack  . Diabetes Maternal Grandfather   . Prostate cancer Paternal Grandfather     Objective: Office vital signs reviewed. BP 128/83   Pulse 88   Temp (!) 97.3 F (36.3 C) (Oral)   Ht 5\' 8"  (1.727 m)   Wt 193 lb (87.5 kg)   BMI 29.35 kg/m   Physical Examination:  General: Awake, alert, well nourished, No acute distress HEENT: normal, MMM Cardio: regular rate and rhythm, S1S2 heard, no  murmurs appreciated Pulm: clear to auscultation bilaterally, no wheezes, rhonchi or rales; normal work of breathing on room air Extremities: warm, well perfused, No edema, cyanosis or clubbing; +2 pulses bilaterally MSK: normal gait and normal station  Assessment/ Plan: 37 y.o. male   1. Essential hypertension Blood pressure at goal.  Continue hydrochlorthiazide 25 mg daily.  1 year supply sent to pharmacy.  Continue to monitor blood pressures.  Follow-up in 6 months.  2. Overweight with body mass index (BMI) 25.0-29.9 Will contact patient with information on medical weight loss clinics in [place].  I offered to refer him to [facility] but he declined this.  Meds ordered this encounter  Medications  . hydrochlorothiazide (HYDRODIURIL) 25 MG tablet    Sig: Take 1 tablet (25 mg total) by mouth daily.     Dispense:  90 tablet    Refill:  4     Shaquoya Cosper Hulen Skains, DO [facility] [phone]

## 2017-12-13 ENCOUNTER — Ambulatory Visit: Payer: BLUE CROSS/BLUE SHIELD | Admitting: Family Medicine

## 2017-12-13 ENCOUNTER — Encounter: Payer: Self-pay | Admitting: Family Medicine

## 2017-12-13 VITALS — BP 128/83 | HR 88 | Temp 97.3°F | Ht 68.0 in | Wt 193.0 lb

## 2017-12-13 DIAGNOSIS — I10 Essential (primary) hypertension: Secondary | ICD-10-CM | POA: Diagnosis not present

## 2017-12-13 DIAGNOSIS — E663 Overweight: Secondary | ICD-10-CM | POA: Diagnosis not present

## 2017-12-13 MED ORDER — HYDROCHLOROTHIAZIDE 25 MG PO TABS: 25.0000 mg | ORAL_TABLET | Freq: Every day | ORAL | 4 refills | Status: DC

## 2017-12-19 ENCOUNTER — Telehealth: Payer: Self-pay | Admitting: Family Medicine

## 2017-12-19 NOTE — Telephone Encounter (Signed)
I recommend the [facility] on [address] in [place], [place].  [phone]

## 2017-12-21 ENCOUNTER — Other Ambulatory Visit: Payer: Self-pay | Admitting: Family Medicine

## 2017-12-26 NOTE — Telephone Encounter (Signed)
Patient aware.

## 2018-01-18 ENCOUNTER — Other Ambulatory Visit: Payer: Self-pay | Admitting: Family Medicine

## 2018-06-19 ENCOUNTER — Telehealth: Payer: Self-pay | Admitting: Family Medicine

## 2018-07-20 ENCOUNTER — Other Ambulatory Visit: Payer: Self-pay | Admitting: Family Medicine

## 2018-07-20 NOTE — Telephone Encounter (Signed)
Last seen 12/13/17  Dr GReece Agar

## 2018-10-14 IMAGING — US US ABDOMEN COMPLETE
1 series · 14 of 25 positions shown · non-contrast
Comparison: CT 12/09/2011

CLINICAL DATA: Elevated LFTs, generalized abdominal pain

EXAM:
ABDOMEN ULTRASOUND COMPLETE

[Series 1: us abdomen complete · 0.25mm/px · 14 of 120 slices shown]
[im 1/120]
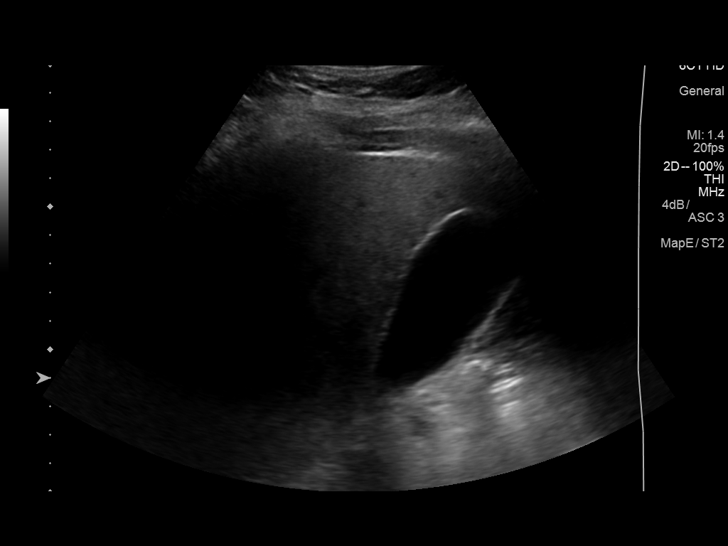
[im 10/120]
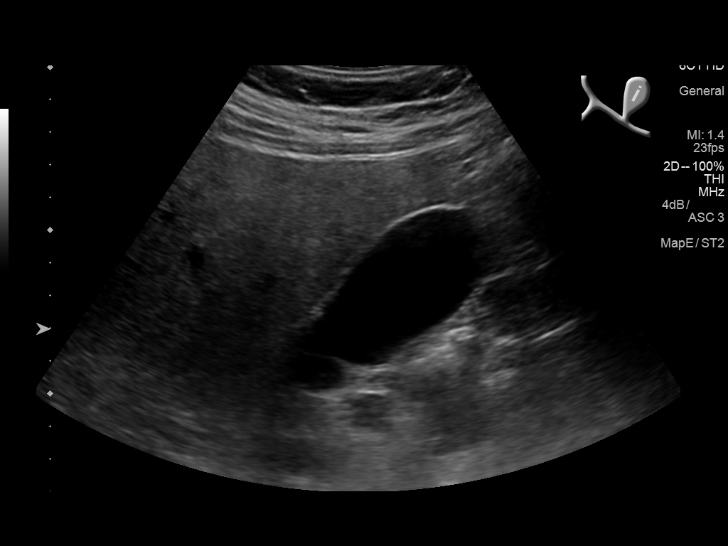
[im 20/120]
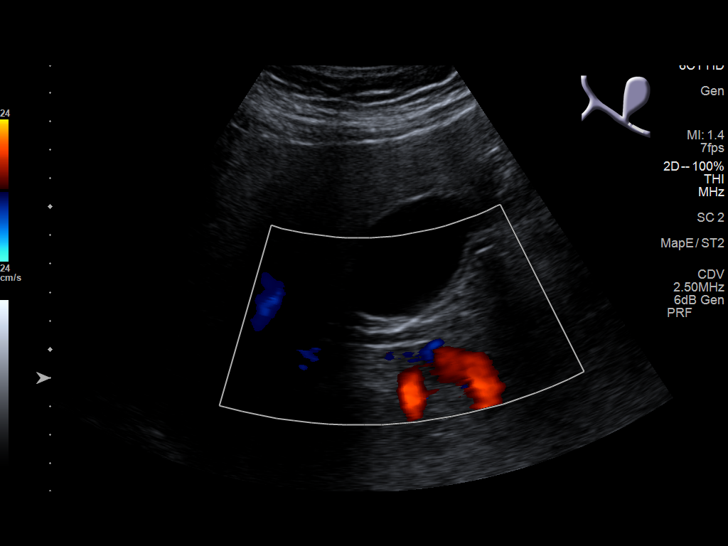
[im 30/120]
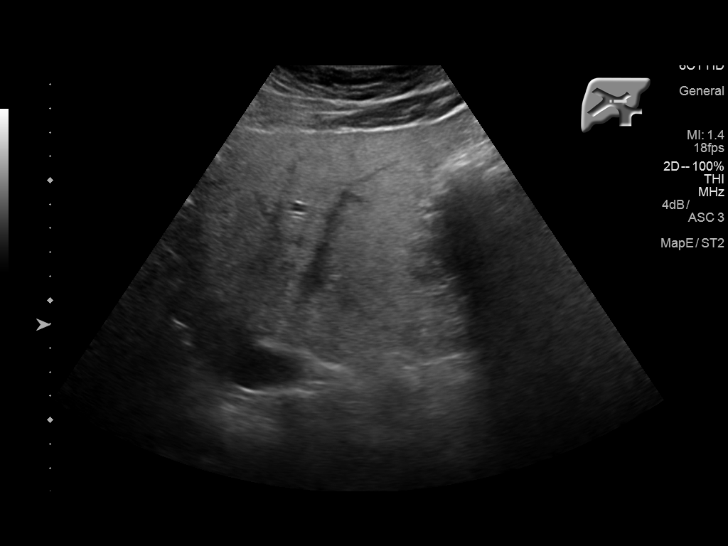
[im 40/120]
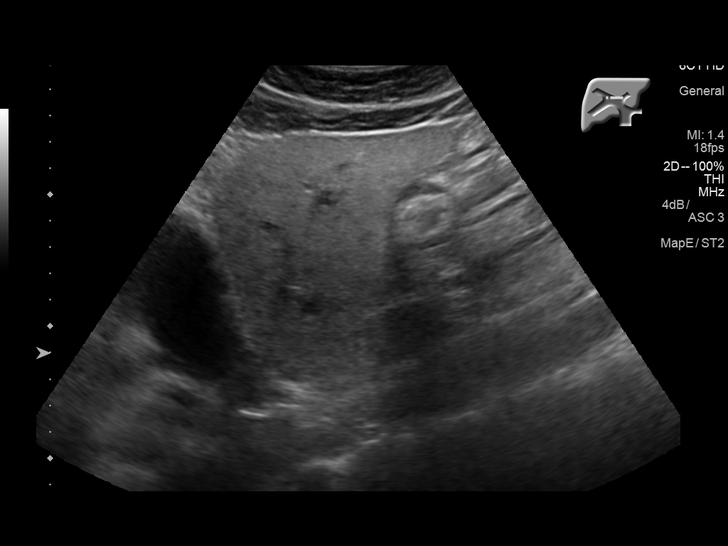
[im 45/120]
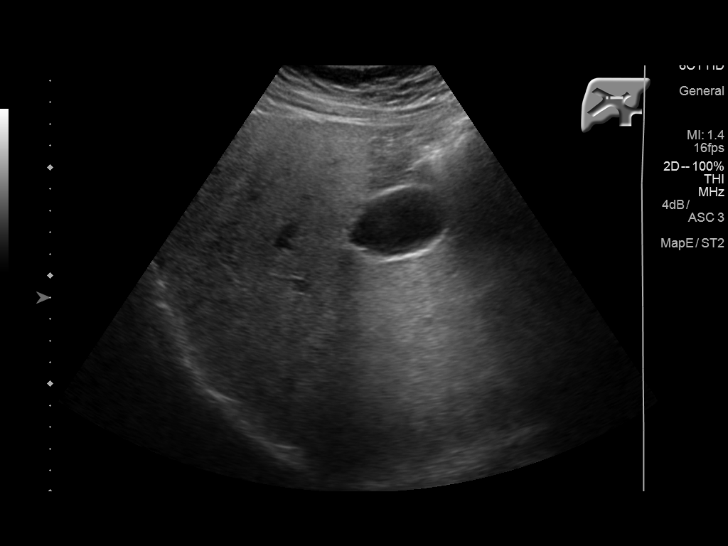
[im 55/120]
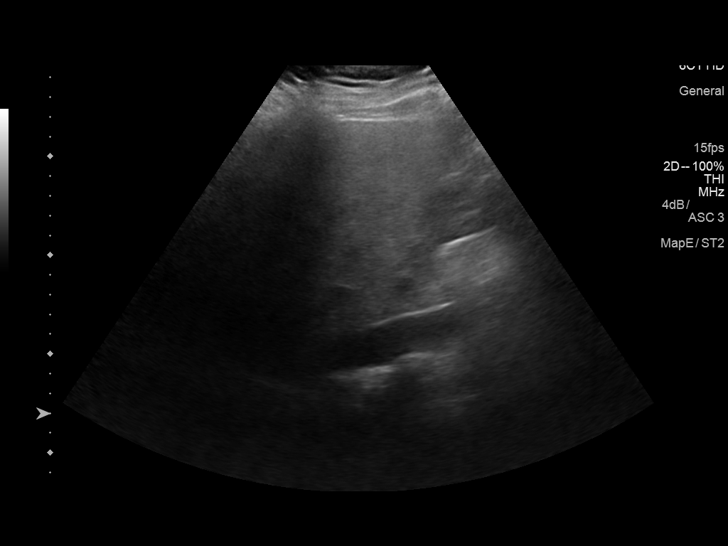
[im 65/120]
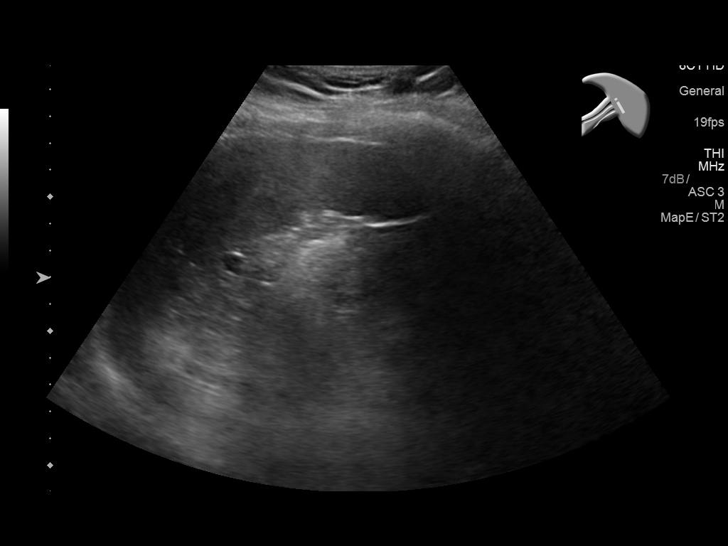
[im 75/120]
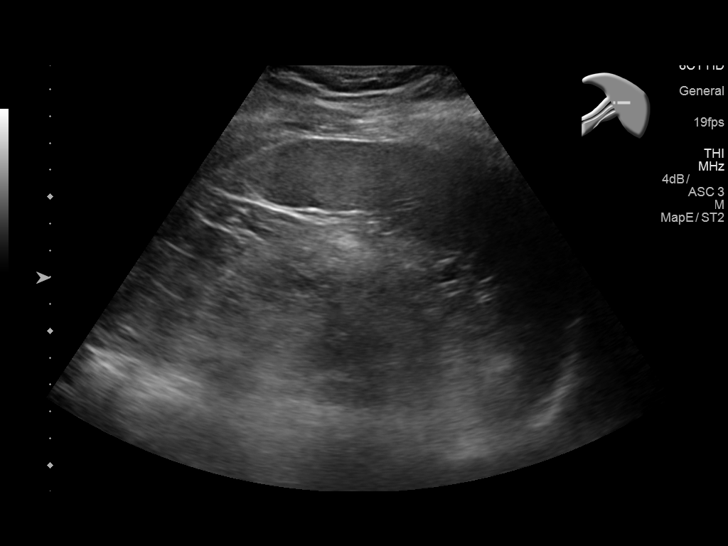
[im 80/120]
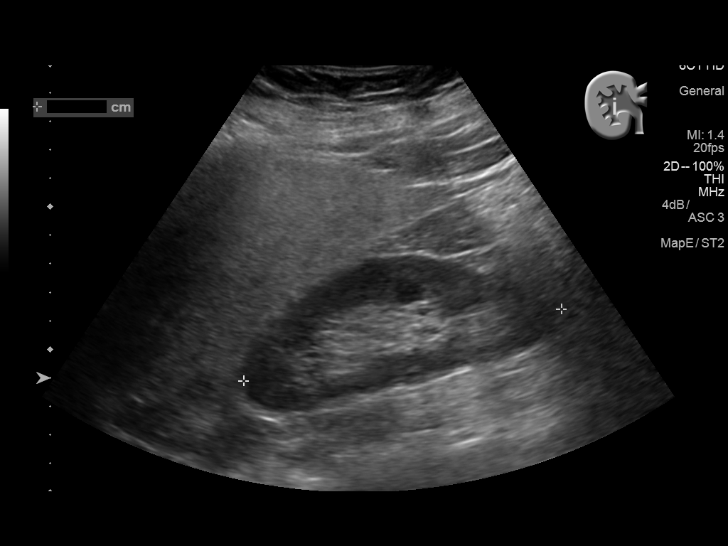
[im 90/120]
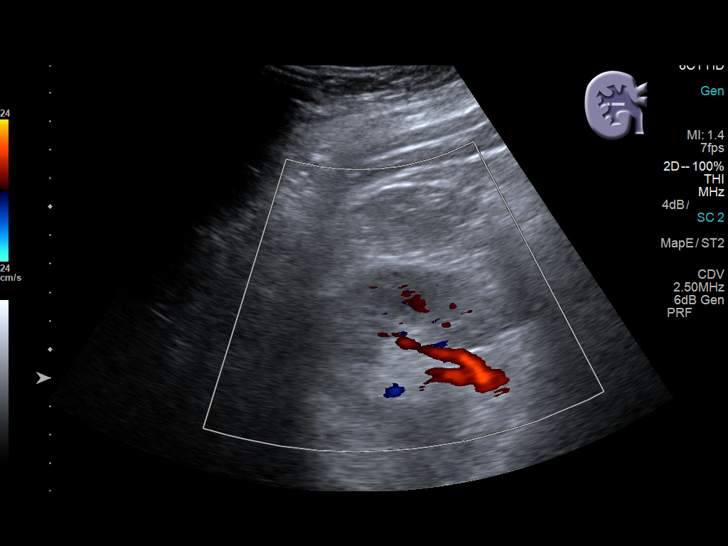
[im 100/120]
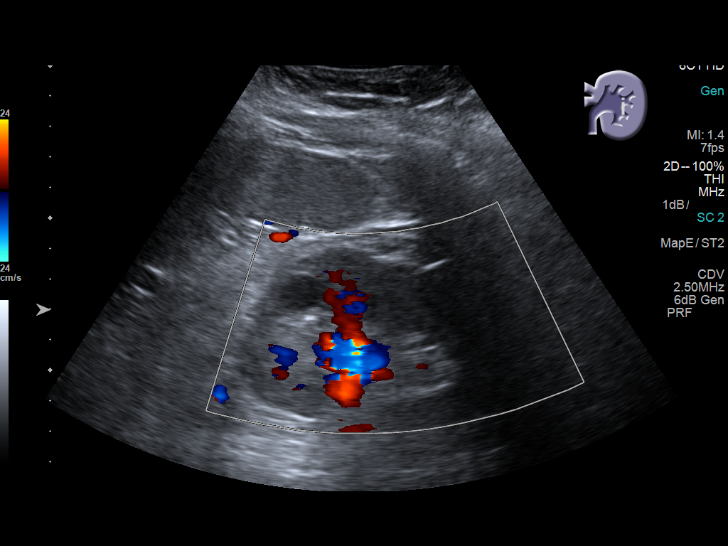
[im 110/120]
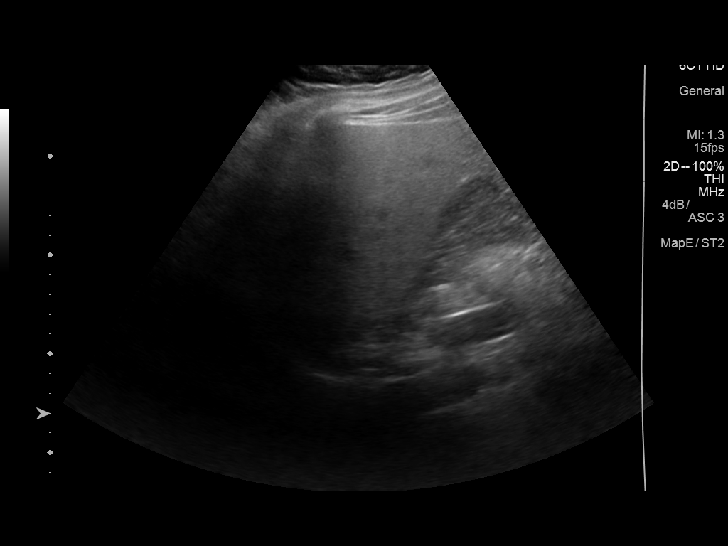
[im 120/120]
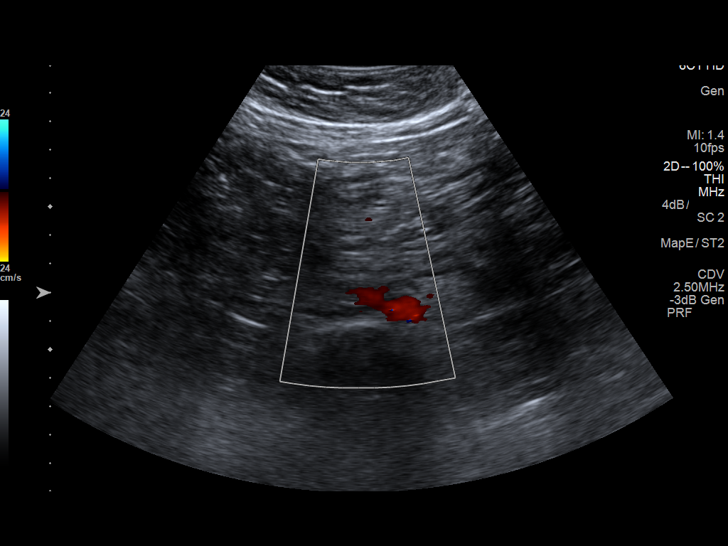

[14 of 25 positions shown; findings below may reference images not displayed]

FINDINGS: Gallbladder: No gallstones or wall thickening visualized. No
sonographic Murphy sign noted by sonographer.

Common bile duct: Diameter: Normal caliber, 4 mm

Liver: Increased echotexture compatible with fatty infiltration. No
focal abnormality or biliary ductal dilatation.

IVC: No abnormality visualized.

Pancreas: Visualized portion unremarkable.

Spleen: Size and appearance within normal limits.

Right Kidney: Length: 11.4 cm. Echogenicity within normal limits. No
mass or hydronephrosis visualized.

Left Kidney: Length: 11.2 cm. 1.5 cm cyst in the midpole.
Echogenicity within normal limits. No mass or hydronephrosis
visualized.

Abdominal aorta: No aneurysm visualized.

Other findings: None.
IMPRESSION: Fatty infiltration of the liver.  No acute findings.

## 2018-11-10 ENCOUNTER — Other Ambulatory Visit: Payer: Self-pay | Admitting: Nurse Practitioner

## 2019-01-23 ENCOUNTER — Ambulatory Visit (INDEPENDENT_AMBULATORY_CARE_PROVIDER_SITE_OTHER): Payer: BC Managed Care – PPO | Admitting: Family Medicine

## 2019-01-23 ENCOUNTER — Other Ambulatory Visit: Payer: Self-pay

## 2019-01-23 DIAGNOSIS — Z7189 Other specified counseling: Secondary | ICD-10-CM

## 2019-01-23 DIAGNOSIS — J0110 Acute frontal sinusitis, unspecified: Secondary | ICD-10-CM | POA: Diagnosis not present

## 2019-01-23 MED ORDER — AMOXICILLIN-POT CLAVULANATE 875-125 MG PO TABS: 1.0000 | ORAL_TABLET | Freq: Two times a day (BID) | ORAL | 0 refills | Status: DC

## 2019-01-23 MED ORDER — PREDNISONE 10 MG (21) PO TBPK: ORAL_TABLET | ORAL | 0 refills | Status: DC

## 2019-01-23 NOTE — Progress Notes (Signed)
Telephone visit  Subjective: CC: sinusitis PCP: [name], DO KFM:Mark Maddox is a 38 y.o. male calls for telephone consult today. Patient provides verbal consent for consult held via phone.  Location of patient: home Location of provider: Working remotely from home Others present for call: none  1.  Sinusitis Patient reports a 4-day history of sinus headache along the frontal region.  He notes that he has associated photosensitivity, occasional rhinorrhea alternating with congestion.  The discharge from his nose is a dark green mixed with blood.  He has been using Tylenol but no other over-the-counter medications for cough or cold.  He notes really no cough unless he is having some drainage to the back of his throat.  Certainly no hemoptysis, fevers, nausea, vomiting or diarrhea.  No known rashes or arthralgias.  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  He works by himself in his own office.  No recent travel.   ROS: Per HPI  No Known Allergies Past Medical History:  Diagnosis Date  . Asthma    had asthma as a child  . Enlarged prostate   . Hypertension    hx of   . Inguinal hernia bilateral, non-recurrent     Current Outpatient Medications:  .  hydrochlorothiazide (HYDRODIURIL) 25 MG tablet, Take 1 tablet (25 mg total) by mouth daily. (Needs to be seen for regular med ckup, Disp: 30 tablet, Rfl: 0  Assessment/ Plan: 38 y.o. male   1. Acute non-recurrent frontal sinusitis Symptoms are very consistent with a sinus infection.  I placed him on Augmentin and a prednisone Dosepak given severity of headache.  We did discuss the possibility of COVID-19 but at this time I do not feel that he has a high suspicion for this infection.  I did offer COVID-19 testing if he desired but at this time he declines.  He will contact me if symptoms are not improving or if they get worse.  At which  point, we should consider referral to [facility] for COVID-19 testing.  He is to continue practicing social distancing as recommended by the CDC. - predniSONE (STERAPRED UNI-PAK 21 TAB) 10 MG (21) TBPK tablet; As directed x 6 days  Dispense: 21 tablet; Refill: 0 - amoxicillin-clavulanate (AUGMENTIN) 875-125 MG tablet; Take 1 tablet by mouth 2 (two) times daily.  Dispense: 20 tablet; Refill: 0  2. Advice Given About Covid-19 Virus by Telephone As above   Start time: 1:44pm End time: 1:50pm  Total time spent on patient care (including telephone call/ virtual visit): 13 minutes  Mark Maddox Hulen Skains, DO [facility] [phone]

## 2019-03-27 ENCOUNTER — Ambulatory Visit (INDEPENDENT_AMBULATORY_CARE_PROVIDER_SITE_OTHER): Payer: BC Managed Care – PPO | Admitting: Family Medicine

## 2019-03-27 ENCOUNTER — Other Ambulatory Visit: Payer: Self-pay

## 2019-03-27 DIAGNOSIS — J0110 Acute frontal sinusitis, unspecified: Secondary | ICD-10-CM | POA: Diagnosis not present

## 2019-03-27 MED ORDER — AZELASTINE HCL 0.1 % NA SOLN: 1.0000 | Freq: Two times a day (BID) | NASAL | 12 refills | Status: DC

## 2019-03-27 NOTE — Progress Notes (Signed)
Telephone visit  Subjective: CC: sinus PCP: Janora Norlander, DO QIO:NGEXB D Heal is a 38 y.o. male calls for telephone consult today. Patient provides verbal consent for consult held via phone.  Location of patient: home Location of provider: Working remotely from home Others present for call: none  1. Sinusitis Patient denies fever.  He reports intermittent productive cough in am and p.m when he lies down.  He reports nose bleeds.  He reports a lot of sinus drainage and sinus pressure.  Onset 2-3 days ago.  He has been working on home remodel.  He reports decreased energy. Denies nausea, vomiting, diarrhea.  Questionable body aches but may be related work.  He has been using Tylenol and Claritin with little relief.  He is unable to tolerate sinus rinses secondary to "gagging".   ROS: Per HPI  No Known Allergies Past Medical History:  Diagnosis Date  . Asthma    had asthma as a child  . Enlarged prostate   . Hypertension    hx of   . Inguinal hernia bilateral, non-recurrent     Current Outpatient Medications:  .  amoxicillin-clavulanate (AUGMENTIN) 875-125 MG tablet, Take 1 tablet by mouth 2 (two) times daily., Disp: 20 tablet, Rfl: 0 .  hydrochlorothiazide (HYDRODIURIL) 25 MG tablet, Take 1 tablet (25 mg total) by mouth daily. (Needs to be seen for regular med ckup, Disp: 30 tablet, Rfl: 0 .  predniSONE (STERAPRED UNI-PAK 21 TAB) 10 MG (21) TBPK tablet, As directed x 6 days, Disp: 21 tablet, Rfl: 0   Gen: does not sound distressed Pulm: no dyspnea with speech  Assessment/ Plan: 38 y.o. male   1. Acute non-recurrent frontal sinusitis I do not think this is infectious.  In fact, I suspect that this is allergy mediated rather than bacterial given constellation of symptoms.  I have encouraged him to continue the Claritin and added Astelin nasal spray to dry up the rhinorrhea.  If he has persistent symptoms despite treatment or worsening of symptoms I encouraged him to  contact me back at which point we can repeat the Augmentin.  He was good understanding of plan and will follow-up PRN - azelastine (ASTELIN) 0.1 % nasal spray; Place 1 spray into both nostrils 2 (two) times daily.  Dispense: 30 mL; Refill: 12   Start time: 8:54am End time: 9:01am  Total time spent on patient care (including telephone call/ virtual visit): 10 minutes  [place], [employer] [phone]

## 2019-03-27 NOTE — Patient Instructions (Signed)

## 2019-05-27 ENCOUNTER — Other Ambulatory Visit: Payer: Self-pay | Admitting: Family Medicine

## 2019-05-27 ENCOUNTER — Telehealth: Payer: Self-pay | Admitting: Family Medicine

## 2019-05-27 DIAGNOSIS — J329 Chronic sinusitis, unspecified: Secondary | ICD-10-CM

## 2019-05-27 NOTE — Telephone Encounter (Signed)
Has this been discussed with provider or  Does patient need to have an office visit ?

## 2019-05-27 NOTE — Telephone Encounter (Signed)
Aware. 

## 2019-05-27 NOTE — Telephone Encounter (Signed)
Given recurrent sinusitis, will refer to ENT to evaluate for requested procedure.

## 2019-06-17 ENCOUNTER — Encounter: Payer: Self-pay | Admitting: Nurse Practitioner

## 2019-06-17 ENCOUNTER — Ambulatory Visit (INDEPENDENT_AMBULATORY_CARE_PROVIDER_SITE_OTHER): Payer: BC Managed Care – PPO | Admitting: Nurse Practitioner

## 2019-06-17 DIAGNOSIS — N400 Enlarged prostate without lower urinary tract symptoms: Secondary | ICD-10-CM

## 2019-06-17 DIAGNOSIS — R300 Dysuria: Secondary | ICD-10-CM | POA: Diagnosis not present

## 2019-06-17 NOTE — Progress Notes (Signed)
   Virtual Visit via telephone Note Due to COVID-19 pandemic this visit was conducted virtually. This visit type was conducted due to national recommendations for restrictions regarding the COVID-19 Pandemic (e.g. social distancing, sheltering in place) in an effort to limit this patient's exposure and mitigate transmission in our community. All issues noted in this document were discussed and addressed.  A physical exam was not performed with this format.  I connected with Mark Maddox on 06/17/19 at 2:00 by telephone and verified that I am speaking with the correct person using two identifiers. Mark Maddox is currently located at home and no one is currently with him during visit. The provider, Mary-Margaret Hassell Done, FNP is located in their office at time of visit.  I discussed the limitations, risks, security and privacy concerns of performing an evaluation and management service by telephone and the availability of in person appointments. I also discussed with the patient that there may be a patient responsible charge related to this service. The patient expressed understanding and agreed to proceed.   History and Present Illness:   Chief Complaint: Dysuria   HPI Patient calls in c/o urinary frequency for several years. He had a prostate check several years ago and was told prostate was enlarged. He says when he climaxes that he gets a bad pain and his semen is greenish. He denies having sex with anyone in several years. He always just assumed it was coming form his prostate.   Review of Systems  Constitutional: Negative.   Respiratory: Negative.   Cardiovascular: Negative.   Genitourinary: Positive for dysuria, frequency and urgency.  Neurological: Negative.   Psychiatric/Behavioral: Negative.   All other systems reviewed and are negative.    Observations/Objective: Alert and oriented- answers all questions appropriately No distress    Assessment and Plan: Mark Maddox  in today with chief complaint of Dysuria   1. Dysuria - Ambulatory referral to Urology  2. Enlarged prostate on rectal examination - Ambulatory referral to Urology   Follow Up Instructions: prn    I discussed the assessment and treatment plan with the patient. The patient was provided an opportunity to ask questions and all were answered. The patient agreed with the plan and demonstrated an understanding of the instructions.   The patient was advised to call back or seek an in-person evaluation if the symptoms worsen or if the condition fails to improve as anticipated.  The above assessment and management plan was discussed with the patient. The patient verbalized understanding of and has agreed to the management plan. Patient is aware to call the clinic if symptoms persist or worsen. Patient is aware when to return to the clinic for a follow-up visit. Patient educated on when it is appropriate to go to the emergency department.   Time call ended:  2:10  I provided 10 minutes of non-face-to-face time during this encounter.    Mary-Margaret Hassell Done, FNP

## 2019-07-05 DIAGNOSIS — J310 Chronic rhinitis: Secondary | ICD-10-CM | POA: Diagnosis not present

## 2019-07-05 DIAGNOSIS — J343 Hypertrophy of nasal turbinates: Secondary | ICD-10-CM | POA: Diagnosis not present

## 2019-07-05 DIAGNOSIS — J342 Deviated nasal septum: Secondary | ICD-10-CM | POA: Diagnosis not present

## 2019-07-15 ENCOUNTER — Ambulatory Visit (INDEPENDENT_AMBULATORY_CARE_PROVIDER_SITE_OTHER): Payer: BC Managed Care – PPO | Admitting: Family Medicine

## 2019-07-15 ENCOUNTER — Other Ambulatory Visit: Payer: Self-pay

## 2019-07-15 DIAGNOSIS — N401 Enlarged prostate with lower urinary tract symptoms: Secondary | ICD-10-CM

## 2019-07-15 DIAGNOSIS — R300 Dysuria: Secondary | ICD-10-CM | POA: Diagnosis not present

## 2019-07-15 DIAGNOSIS — R3916 Straining to void: Secondary | ICD-10-CM | POA: Diagnosis not present

## 2019-07-15 LAB — URINALYSIS, COMPLETE
Bilirubin, UA: NEGATIVE
Glucose, UA: NEGATIVE
Ketones, UA: NEGATIVE
Leukocytes,UA: NEGATIVE
Nitrite, UA: NEGATIVE
Protein,UA: NEGATIVE
RBC, UA: NEGATIVE
Specific Gravity, UA: 1.015 (ref 1.005–1.030)
Urobilinogen, Ur: 0.2 mg/dL (ref 0.2–1.0)
pH, UA: 5.5 (ref 5.0–7.5)

## 2019-07-15 LAB — MICROSCOPIC EXAMINATION
Bacteria, UA: NONE SEEN
Epithelial Cells (non renal): NONE SEEN /hpf (ref 0–10)
RBC, Urine: NONE SEEN /hpf (ref 0–2)
Renal Epithel, UA: NONE SEEN /hpf
WBC, UA: NONE SEEN /hpf (ref 0–5)

## 2019-07-15 MED ORDER — TAMSULOSIN HCL 0.4 MG PO CAPS: 0.4000 mg | ORAL_CAPSULE | Freq: Every day | ORAL | 3 refills | Status: DC

## 2019-07-15 MED ORDER — SULFAMETHOXAZOLE-TRIMETHOPRIM 800-160 MG PO TABS: 1.0000 | ORAL_TABLET | Freq: Two times a day (BID) | ORAL | 0 refills | Status: AC

## 2019-07-15 NOTE — Addendum Note (Signed)
Addended by: Pollyann Kennedy F on: 07/15/2019 12:40 PM   Modules accepted: Orders

## 2019-07-15 NOTE — Progress Notes (Signed)
Telephone visit  Subjective: CC: dysuria PCP: Janora Norlander, DO OYD:XAJOI D Coone is a 38 y.o. male calls for telephone consult today. Patient provides verbal consent for consult held via phone.  Location of patient: home Location of provider: [facility] Others present for call: none  1.  Dysuria Patient reports several day history of dysuria.  It actually is getting slightly better today.  He finds that when he is dehydrated dysuria occurs.  He denies any penile discharge, scrotal swelling, fevers, chills, nausea, vomiting.  He has had mild low back pain.  No hematuria.  He is not sexually active.  He was actually seen for similar about a month ago and referred to urology but has not yet heard for an appointment.  He does report a history of BPH that was relieved by Flomax.  However, he discontinued Flomax because he was having problems with ejaculation, which she describes as no ejaculates occurring despite having climax.   ROS: Per HPI  No Known Allergies Past Medical History:  Diagnosis Date  . Asthma    had asthma as a child  . Enlarged prostate   . Hypertension    hx of   . Inguinal hernia bilateral, non-recurrent     Current Outpatient Medications:  .  azelastine (ASTELIN) 0.1 % nasal spray, Place 1 spray into both nostrils 2 (two) times daily., Disp: 30 mL, Rfl: 12 .  hydrochlorothiazide (HYDRODIURIL) 25 MG tablet, Take 1 tablet (25 mg total) by mouth daily. (Needs to be seen for regular med ckup, Disp: 30 tablet, Rfl: 0  Assessment/ Plan: 38 y.o. male   1. Dysuria Uncertain etiology.  Seems to correlate with dehydration.  Because were going into a holiday I am going to empirically treat him for a urinary tract infection.  He will come by at his earliest availability to leave a urine sample which I will culture.  He has a referral in place for urology.  He has not yet been contacted for this I have reached out to the referral coordinator to check on this.  He understands  red flag signs and symptoms warranting further evaluation.  Have also gone ahead and placed him back on Flomax. - urinalysis- dip and micro; Future - Urine culture; Future - sulfamethoxazole-trimethoprim (BACTRIM DS) 800-160 MG tablet; Take 1 tablet by mouth 2 (two) times daily for 10 days.  Dispense: 20 tablet; Refill: 0  2. Benign prostatic hyperplasia (BPH) with straining on urination Resume use of Flomax - tamsulosin (FLOMAX) 0.4 MG CAPS capsule; Take 1 capsule (0.4 mg total) by mouth daily.  Dispense: 30 capsule; Refill: 3   Start time: 8:28am End time: 8:34am  Total time spent on patient care (including telephone call/ virtual visit): 15 minutes  [place], [place] [phone]

## 2019-07-17 LAB — URINE CULTURE

## 2019-08-05 ENCOUNTER — Telehealth: Payer: Self-pay | Admitting: Family Medicine

## 2019-08-05 DIAGNOSIS — R300 Dysuria: Secondary | ICD-10-CM

## 2019-08-05 NOTE — Telephone Encounter (Signed)
His urine culture and urinalysis were totally negative for infection.  He is welcome to provide another sample and we could look again.

## 2019-08-05 NOTE — Telephone Encounter (Signed)
Patients states he still has burning with urination. The abx helped, but has came back since he finished. Pt states he is staying hydrated and he is drinking plenty of water. He states this burning sensation has been going on for a month. Pt has appointment with [facility] on 09/01/2018. Any recommendation?

## 2019-08-05 NOTE — Telephone Encounter (Signed)
Lmtcb.

## 2019-08-05 NOTE — Telephone Encounter (Signed)
Patient would like a call back regarding his results from his most recent urine sample.

## 2019-08-05 NOTE — Telephone Encounter (Signed)
Patient aware verbalized understanding will come in Thursday to leave urine. Will put in order.

## 2019-08-08 ENCOUNTER — Other Ambulatory Visit: Payer: BC Managed Care – PPO

## 2019-08-08 ENCOUNTER — Other Ambulatory Visit: Payer: Self-pay

## 2019-08-08 DIAGNOSIS — R300 Dysuria: Secondary | ICD-10-CM

## 2019-08-08 LAB — URINALYSIS, COMPLETE
Bilirubin, UA: NEGATIVE
Ketones, UA: NEGATIVE
Leukocytes,UA: NEGATIVE
Nitrite, UA: NEGATIVE
Protein,UA: NEGATIVE
RBC, UA: NEGATIVE
Specific Gravity, UA: >1.03 — ABNORMAL HIGH (ref 1.005–1.030)
Urobilinogen, Ur: 0.2 mg/dL (ref 0.2–1.0)
pH, UA: 5 (ref 5.0–7.5)

## 2019-08-08 LAB — MICROSCOPIC EXAMINATION
Bacteria, UA: NONE SEEN
Epithelial Cells (non renal): NONE SEEN /hpf (ref 0–10)
RBC, Urine: NONE SEEN /hpf (ref 0–2)
Renal Epithel, UA: NONE SEEN /hpf
WBC, UA: NONE SEEN /hpf (ref 0–5)

## 2019-08-10 LAB — URINE CULTURE: Organism ID, Bacteria: NO GROWTH

## 2019-08-19 DIAGNOSIS — J3089 Other allergic rhinitis: Secondary | ICD-10-CM | POA: Diagnosis not present

## 2019-08-19 DIAGNOSIS — H1045 Other chronic allergic conjunctivitis: Secondary | ICD-10-CM | POA: Diagnosis not present

## 2019-08-19 DIAGNOSIS — J3081 Allergic rhinitis due to animal (cat) (dog) hair and dander: Secondary | ICD-10-CM | POA: Diagnosis not present

## 2019-08-22 DIAGNOSIS — J3089 Other allergic rhinitis: Secondary | ICD-10-CM | POA: Diagnosis not present

## 2019-08-27 ENCOUNTER — Ambulatory Visit: Payer: BC Managed Care – PPO | Attending: Internal Medicine

## 2019-08-27 ENCOUNTER — Other Ambulatory Visit: Payer: Self-pay

## 2019-08-27 DIAGNOSIS — Z20822 Contact with and (suspected) exposure to covid-19: Secondary | ICD-10-CM | POA: Diagnosis not present

## 2019-08-27 MED ORDER — HYDROCHLOROTHIAZIDE 25 MG PO TABS: 25.0000 mg | ORAL_TABLET | Freq: Every day | ORAL | 0 refills | Status: DC

## 2019-08-29 LAB — NOVEL CORONAVIRUS, NAA: SARS-CoV-2, NAA: NOT DETECTED

## 2019-09-02 DIAGNOSIS — R3915 Urgency of urination: Secondary | ICD-10-CM | POA: Diagnosis not present

## 2019-09-02 DIAGNOSIS — R350 Frequency of micturition: Secondary | ICD-10-CM | POA: Diagnosis not present

## 2019-09-02 DIAGNOSIS — R102 Pelvic and perineal pain: Secondary | ICD-10-CM | POA: Diagnosis not present

## 2019-09-19 ENCOUNTER — Other Ambulatory Visit: Payer: Self-pay | Admitting: Family Medicine

## 2019-09-19 NOTE — Telephone Encounter (Signed)
Patient aware and states he has enough for another month or two.  Appt made for a follow up

## 2019-09-19 NOTE — Telephone Encounter (Signed)
ntbs  30 day supply was given 08/27/19

## 2019-09-25 ENCOUNTER — Encounter: Payer: Self-pay | Admitting: Family Medicine

## 2019-09-26 ENCOUNTER — Encounter: Payer: Self-pay | Admitting: Family Medicine

## 2019-09-26 ENCOUNTER — Ambulatory Visit (INDEPENDENT_AMBULATORY_CARE_PROVIDER_SITE_OTHER): Payer: BC Managed Care – PPO | Admitting: Family Medicine

## 2019-09-26 DIAGNOSIS — J019 Acute sinusitis, unspecified: Secondary | ICD-10-CM | POA: Diagnosis not present

## 2019-09-26 MED ORDER — AMOXICILLIN-POT CLAVULANATE 875-125 MG PO TABS: 1.0000 | ORAL_TABLET | Freq: Two times a day (BID) | ORAL | 0 refills | Status: AC

## 2019-09-26 MED ORDER — PREDNISONE 10 MG (21) PO TBPK: ORAL_TABLET | ORAL | 0 refills | Status: DC

## 2019-09-26 NOTE — Progress Notes (Signed)
   Virtual Visit via Telephone Note  I connected with Mark Maddox on 09/26/19 at 9:08 AM by telephone and verified that I am speaking with the correct person using two identifiers. Mark Maddox is currently located at home and nobody is currently with him during this visit. The provider, Gwenlyn Fudge, FNP is located in their home at time of visit.  I discussed the limitations, risks, security and privacy concerns of performing an evaluation and management service by telephone and the availability of in person appointments. I also discussed with the patient that there may be a patient responsible charge related to this service. The patient expressed understanding and agreed to proceed.  Subjective: PCP: [name], DO  Chief Complaint  Patient presents with  . URI   Patient complains of cough, fever and fatigue. Additional symptoms include head congestion, headache, sore throat, ear pain/pressure, postnasal drainage, body aches, and nausea. Onset of symptoms was 2 days ago, slightly improving since that time. He is drinking moderate amounts of fluids. Evaluation to date: none. Treatment to date: antihistamines, nasal steroids, Tylenol and saline spray. He has a history of asthma. He does not smoke.    ROS: Per HPI  Current Outpatient Medications:  .  azelastine (ASTELIN) 0.1 % nasal spray, Place 1 spray into both nostrils 2 (two) times daily., Disp: 30 mL, Rfl: 12 .  hydrochlorothiazide (HYDRODIURIL) 25 MG tablet, Take 1 tablet (25 mg total) by mouth daily. (Needs to be seen for regular med ckup, Disp: 30 tablet, Rfl: 0 .  tamsulosin (FLOMAX) 0.4 MG CAPS capsule, Take 1 capsule (0.4 mg total) by mouth daily., Disp: 30 capsule, Rfl: 3  No Known Allergies Past Medical History:  Diagnosis Date  . Asthma    had asthma as a child  . Enlarged prostate   . Hypertension    hx of   . Inguinal hernia bilateral, non-recurrent     Observations/Objective: A&O  No respiratory  distress or wheezing audible over the phone Mood, judgement, and thought processes all WNL  Assessment and Plan: 1. Acute non-recurrent sinusitis, unspecified location - Discussed symptom management. Patient is going to go ahead and schedule a COVID-19 test. Education provided on sinusitis and COVID-19.  - amoxicillin-clavulanate (AUGMENTIN) 875-125 MG tablet; Take 1 tablet by mouth 2 (two) times daily for 7 days.  Dispense: 14 tablet; Refill: 0 - predniSONE (STERAPRED UNI-PAK 21 TAB) 10 MG (21) TBPK tablet; As directed x 6 days  Dispense: 21 tablet; Refill: 0   Follow Up Instructions:  I discussed the assessment and treatment plan with the patient. The patient was provided an opportunity to ask questions and all were answered. The patient agreed with the plan and demonstrated an understanding of the instructions.   The patient was advised to call back or seek an in-person evaluation if the symptoms worsen or if the condition fails to improve as anticipated.  The above assessment and management plan was discussed with the patient. The patient verbalized understanding of and has agreed to the management plan. Patient is aware to call the clinic if symptoms persist or worsen. Patient is aware when to return to the clinic for a follow-up visit. Patient educated on when it is appropriate to go to the emergency department.   Time call ended: 9:19 AM  I provided 13 minutes of non-face-to-face time during this encounter.  Deliah Boston, MSN, APRN, FNP-C [facility] 09/26/19

## 2019-09-26 NOTE — Patient Instructions (Signed)
Sinusitis, Adult Sinusitis is soreness and swelling (inflammation) of your sinuses. Sinuses are hollow spaces in the bones around your face. They are located:  Around your eyes.  In the middle of your forehead.  Behind your nose.  In your cheekbones. Your sinuses and nasal passages are lined with a fluid called mucus. Mucus drains out of your sinuses. Swelling can trap mucus in your sinuses. This lets germs (bacteria, virus, or fungus) grow, which leads to infection. Most of the time, this condition is caused by a virus. What are the causes? This condition is caused by:  Allergies.  Asthma.  Germs.  Things that block your nose or sinuses.  Growths in the nose (nasal polyps).  Chemicals or irritants in the air.  Fungus (rare). What increases the risk? You are more likely to develop this condition if:  You have a weak body defense system (immune system).  You do a lot of swimming or diving.  You use nasal sprays too much.  You smoke. What are the signs or symptoms? The main symptoms of this condition are pain and a feeling of pressure around the sinuses. Other symptoms include:  Stuffy nose (congestion).  Runny nose (drainage).  Swelling and warmth in the sinuses.  Headache.  Toothache.  A cough that may get worse at night.  Mucus that collects in the throat or the back of the nose (postnasal drip).  Being unable to smell and taste.  Being very tired (fatigue).  A fever.  Sore throat.  Bad breath. How is this diagnosed? This condition is diagnosed based on:  Your symptoms.  Your medical history.  A physical exam.  Tests to find out if your condition is short-term (acute) or long-term (chronic). Your doctor may: ? Check your nose for growths (polyps). ? Check your sinuses using a tool that has a light (endoscope). ? Check for allergies or germs. ? Do imaging tests, such as an MRI or CT scan. How is this treated? Treatment for this condition  depends on the cause and whether it is short-term or long-term.  If caused by a virus, your symptoms should go away on their own within 10 days. You may be given medicines to relieve symptoms. They include: ? Medicines that shrink swollen tissue in the nose. ? Medicines that treat allergies (antihistamines). ? A spray that treats swelling of the nostrils. ? Rinses that help get rid of thick mucus in your nose (nasal saline washes).  If caused by bacteria, your doctor may wait to see if you will get better without treatment. You may be given antibiotic medicine if you have: ? A very bad infection. ? A weak body defense system.  If caused by growths in the nose, you may need to have surgery. Follow these instructions at home: Medicines  Take, use, or apply over-the-counter and prescription medicines only as told by your doctor. These may include nasal sprays.  If you were prescribed an antibiotic medicine, take it as told by your doctor. Do not stop taking the antibiotic even if you start to feel better. Hydrate and humidify   Drink enough water to keep your pee (urine) pale yellow.  Use a cool mist humidifier to keep the humidity level in your home above 50%.  Breathe in steam for 10-15 minutes, 3-4 times a day, or as told by your doctor. You can do this in the bathroom while a hot shower is running.  Try not to spend time in cool or dry air.   Rest  Rest as much as you can.  Sleep with your head raised (elevated).  Make sure you get enough sleep each night. General instructions   Put a warm, moist washcloth on your face 3-4 times a day, or as often as told by your doctor. This will help with discomfort.  Wash your hands often with soap and water. If there is no soap and water, use hand sanitizer.  Do not smoke. Avoid being around people who are smoking (secondhand smoke).  Keep all follow-up visits as told by your doctor. This is important. Contact a doctor if:  You  have a fever.  Your symptoms get worse.  Your symptoms do not get better within 10 days. Get help right away if:  You have a very bad headache.  You cannot stop throwing up (vomiting).  You have very bad pain or swelling around your face or eyes.  You have trouble seeing.  You feel confused.  Your neck is stiff.  You have trouble breathing. Summary  Sinusitis is swelling of your sinuses. Sinuses are hollow spaces in the bones around your face.  This condition is caused by tissues in your nose that become inflamed or swollen. This traps germs. These can lead to infection.  If you were prescribed an antibiotic medicine, take it as told by your doctor. Do not stop taking it even if you start to feel better.  Keep all follow-up visits as told by your doctor. This is important. This information is not intended to replace advice given to you by your health care provider. Make sure you discuss any questions you have with your health care provider. Document Revised: 01/08/2018 Document Reviewed: 01/08/2018 Elsevier Patient Education  2020 [employer].  Prevent the Spread of COVID-19 if You Are Sick If you are sick with COVID-19 or think you might have COVID-19, follow the steps below to care for yourself and to help protect other people in your home and community. Stay home except to get medical care.  Stay home. Most people with COVID-19 have mild illness and are able to recover at home without medical care. Do not leave your home, except to get medical care. Do not visit public areas.  Take care of yourself. Get rest and stay hydrated. Take over-the-counter medicines, such as acetaminophen, to help you feel better.  Stay in touch with your doctor. Call before you get medical care. Be sure to get care if you have trouble breathing, or have any other emergency warning signs, or if you think it is an emergency.  Avoid public transportation, ride-sharing, or taxis. Separate  yourself from other people and pets in your home.  As much as possible, stay in a specific room and away from other people and pets in your home. Also, you should use a separate bathroom, if available. If you need to be around other people or animals in or outside of the home, wear a mask. ? See COVID-19 and Animals if you have questions about pets:www.cdc.gov/coronavirus/2019-ncov/faq.html#COVID19animals. ? Additional guidance is available for those living in close quarters. (https://www.cdc.gov/coronavirus/2019-ncov/daily-life-coping/living-in-close-quarters.html) and shared housing (https://www.cdc.gov/coronavirus/2019-ncov/daily-life-coping/shared-housing/index.html). Monitor your symptoms.  Symptoms of COVID-19 include fever, cough, and shortness of breath but other symptoms may be present as well.  Follow care instructions from your healthcare provider and local health department. Your local health authorities will give instructions on checking your symptoms and reporting information. When to Seek Emergency Medical Attention Look for emergency warning signs* for COVID-19. If someone is showing any of these   signs, seek emergency medical care immediately:  Trouble breathing  Persistent pain or pressure in the chest  New confusion  Bluish lips or face  Inability to wake or stay awake *This list is not all possible symptoms. Please call your medical provider for any other symptoms that are severe or concerning to you. Call 911 or call ahead to your local emergency facility: Notify the operator that you are seeking care for someone who has or may have COVID-19. Call ahead before visiting your doctor.  Call ahead. Many medical visits for routine care are being postponed or done by phone or telemedicine.  If you have a medical appointment that cannot be postponed, call your doctor's office, and tell them you have or may have COVID-19. If you are sick, wear a mask over your nose and mouth.   You should wear a mask over your nose and mouth if you must be around other people or animals, including pets (even at home).  You don't need to wear the mask if you are alone. If you can't put on a mask (because of trouble breathing for example), cover your coughs and sneezes in some other way. Try to stay at least 6 feet away from other people. This will help protect the people around you.  Masks should not be placed on young children under age 2 years, anyone who has trouble breathing, or anyone who is not able to remove the mask without help. Note: During the COVID-19 pandemic, medical grade facemasks are reserved for healthcare workers and some first responders. You may need to make a mask using a scarf or bandana. Cover your coughs and sneezes.  Cover your mouth and nose with a tissue when you cough or sneeze.  Throw used tissues in a lined trash can.  Immediately wash your hands with soap and water for at least 20 seconds. If soap and water are not available, clean your hands with an alcohol-based hand sanitizer that contains at least 60% alcohol. Clean your hands often.  Wash your hands often with soap and water for at least 20 seconds. This is especially important after blowing your nose, coughing, or sneezing; going to the bathroom; and before eating or preparing food.  Use hand sanitizer if soap and water are not available. Use an alcohol-based hand sanitizer with at least 60% alcohol, covering all surfaces of your hands and rubbing them together until they feel dry.  Soap and water are the best option, especially if your hands are visibly dirty.  Avoid touching your eyes, nose, and mouth with unwashed hands. Avoid sharing personal household items.  Do not share dishes, drinking glasses, cups, eating utensils, towels, or bedding with other people in your home.  Wash these items thoroughly after using them with soap and water or put them in the dishwasher. Clean all  "high-touch" surfaces everyday.  Clean and disinfect high-touch surfaces in your "sick room" and bathroom. Let someone else clean and disinfect surfaces in common areas, but not your bedroom and bathroom.  If a caregiver or other person needs to clean and disinfect a sick person's bedroom or bathroom, they should do so on an as-needed basis. The caregiver/other person should wear a mask and wait as long as possible after the sick person has used the bathroom. High-touch surfaces include phones, remote controls, counters, tabletops, doorknobs, bathroom fixtures, toilets, keyboards, tablets, and bedside tables.  Clean and disinfect areas that may have blood, stool, or body fluids on them.  Use household   cleaners and disinfectants. Clean the area or item with soap and water or another detergent if it is dirty. Then use a household disinfectant. ? Be sure to follow the instructions on the label to ensure safe and effective use of the product. Many products recommend keeping the surface wet for several minutes to ensure germs are killed. Many also recommend precautions such as wearing gloves and making sure you have good ventilation during use of the product. ? Most EPA-registered household disinfectants should be effective. When you can be around others after you had or likely had COVID-19 When you can be around others (end home isolation) depends on different factors for different situations.  I think or know I had COVID-19, and I had symptoms ? You can be with others after  24 hours with no fever AND  Symptoms improved AND  10 days since symptoms first appeared ? Depending on your healthcare provider's advice and availability of testing, you might get tested to see if you still have COVID-19. If you will be tested, you can be around others when you have no fever, symptoms have improved, and you receive two negative test results in a row, at least 24 hours apart.  I tested positive for COVID-19  but had no symptoms ? If you continue to have no symptoms, you can be with others after:  10 days have passed since test ? Depending on your healthcare provider's advice and availability of testing, you might get tested to see if you still have COVID-19. If you will be tested, you can be around others after you receive two negative test results in a row, at least 24 hours apart. ? If you develop symptoms after testing positive, follow the guidance above for "I think or know I had COVID, and I had symptoms." cdc.gov/coronavirus 04/02/2019 This information is not intended to replace advice given to you by your health care provider. Make sure you discuss any questions you have with your health care provider. Document Revised: 04/18/2019 Document Reviewed: 02/19/2019 Elsevier Patient Education  2020 Elsevier Inc.   

## 2019-10-16 ENCOUNTER — Ambulatory Visit: Payer: BC Managed Care – PPO | Admitting: Family Medicine

## 2019-11-08 ENCOUNTER — Ambulatory Visit: Payer: BC Managed Care – PPO | Admitting: Family Medicine

## 2019-12-03 ENCOUNTER — Other Ambulatory Visit: Payer: Self-pay

## 2019-12-03 ENCOUNTER — Encounter: Payer: Self-pay | Admitting: Family Medicine

## 2019-12-03 ENCOUNTER — Ambulatory Visit: Payer: BC Managed Care – PPO | Admitting: Family Medicine

## 2019-12-03 VITALS — BP 144/97 | HR 89 | Temp 98.3°F | Ht 68.0 in | Wt 195.2 lb

## 2019-12-03 DIAGNOSIS — I10 Essential (primary) hypertension: Secondary | ICD-10-CM

## 2019-12-03 DIAGNOSIS — J301 Allergic rhinitis due to pollen: Secondary | ICD-10-CM

## 2019-12-03 DIAGNOSIS — E663 Overweight: Secondary | ICD-10-CM | POA: Diagnosis not present

## 2019-12-03 DIAGNOSIS — Z130 Encounter for screening for diseases of the blood and blood-forming organs and certain disorders involving the immune mechanism: Secondary | ICD-10-CM

## 2019-12-03 MED ORDER — ALBUTEROL SULFATE HFA 108 (90 BASE) MCG/ACT IN AERS: INHALATION_SPRAY | RESPIRATORY_TRACT | 0 refills | Status: DC

## 2019-12-03 MED ORDER — FLUTICASONE PROPIONATE 50 MCG/ACT NA SUSP: 2.0000 | Freq: Every day | NASAL | 12 refills | Status: DC

## 2019-12-03 MED ORDER — AMLODIPINE BESYLATE 5 MG PO TABS: 5.0000 mg | ORAL_TABLET | Freq: Every day | ORAL | 3 refills | Status: DC

## 2019-12-03 MED ORDER — MONTELUKAST SODIUM 10 MG PO TABS: 10.0000 mg | ORAL_TABLET | Freq: Every day | ORAL | 3 refills | Status: DC

## 2019-12-03 MED ORDER — HYDROCHLOROTHIAZIDE 25 MG PO TABS: 25.0000 mg | ORAL_TABLET | Freq: Every day | ORAL | 3 refills | Status: DC

## 2019-12-03 MED ORDER — AZELASTINE HCL 0.1 % NA SOLN: 1.0000 | Freq: Two times a day (BID) | NASAL | 12 refills | Status: DC

## 2019-12-03 NOTE — Progress Notes (Signed)
Subjective: CC: HTN PCP: Janora Norlander, DO JXB:JYNWG Mark Maddox is a 39 y.o. male presenting to clinic today for:  1. Hypertension Patient reports compliance with HCTZ 12m.  He notices that blood pressure continues to remain elevated, particularly on the diastolic blood pressure.  He denies any chest pain, shortness of breath, change in physical activity tolerance.  He was started on phentermine 37.5 mg daily by a bariatric clinic.  He had about a 28 pound weight loss but is starting to gain some of this back now.  2.  Allergic rhinitis Patient reports good control of allergy symptoms with Flonase, Astelin and Singulair.  He needs refills on all of these.  No cough, congestion, rhinorrhea.  Very rare use for albuterol.  ROS: Per HPI  No Known Allergies Past Medical History:  Diagnosis Date  . Asthma    had asthma as a child  . Enlarged prostate   . Hypertension    hx of   . Inguinal hernia bilateral, non-recurrent     Current Outpatient Medications:  .  azelastine (ASTELIN) 0.1 % nasal spray, Place 1 spray into both nostrils 2 (two) times daily., Disp: 30 mL, Rfl: 12 .  fluticasone (FLONASE) 50 MCG/ACT nasal spray, Place 2 sprays into both nostrils daily., Disp: , Rfl:  .  hydrochlorothiazide (HYDRODIURIL) 25 MG tablet, Take 1 tablet (25 mg total) by mouth daily. (Needs to be seen for regular med ckup, Disp: 30 tablet, Rfl: 0 .  predniSONE (STERAPRED UNI-PAK 21 TAB) 10 MG (21) TBPK tablet, As directed x 6 days, Disp: 21 tablet, Rfl: 0 .  tamsulosin (FLOMAX) 0.4 MG CAPS capsule, Take 1 capsule (0.4 mg total) by mouth daily., Disp: 30 capsule, Rfl: 3 Social History   Socioeconomic History  . Marital status: Single    Spouse name: Not on file  . Number of children: 0  . Years of education: Not on file  . Highest education level: Not on file  Occupational History  . Occupation: truck dGeophysicist/field seismologist Tobacco Use  . Smoking status: Never Smoker  . Smokeless tobacco: Never Used   Substance and Sexual Activity  . Alcohol use: No  . Drug use: No  . Sexual activity: Not on file  Other Topics Concern  . Not on file  Social History Narrative  . Not on file   Social Determinants of Health   Financial Resource Strain:   . Difficulty of Paying Living Expenses:   Food Insecurity:   . Worried About RCharity fundraiserin the Last Year:   . RArboriculturistin the Last Year:   Transportation Needs:   . LFilm/video editor(Medical):   .Marland KitchenLack of Transportation (Non-Medical):   Physical Activity:   . Days of Exercise per Week:   . Minutes of Exercise per Session:   Stress:   . Feeling of Stress :   Social Connections:   . Frequency of Communication with Friends and Family:   . Frequency of Social Gatherings with Friends and Family:   . Attends Religious Services:   . Active Member of Clubs or Organizations:   . Attends CArchivistMeetings:   .Marland KitchenMarital Status:   Intimate Partner Violence:   . Fear of Current or Ex-Partner:   . Emotionally Abused:   .Marland KitchenPhysically Abused:   . Sexually Abused:    Family History  Problem Relation Age of Onset  . Diabetes Maternal Grandmother   . Heart disease Maternal  Grandfather        heart attack  . Diabetes Maternal Grandfather   . Prostate cancer Paternal Grandfather     Objective: Office vital signs reviewed. BP (!) 144/97   Pulse 89   Temp 98.3 F (36.8 C)   Ht 5' 8"  (1.727 m)   Wt 195 lb 3.2 oz (88.5 kg)   SpO2 94%   BMI 29.68 kg/m   Physical Examination:  General: Awake, alert, well nourished, No acute distress HEENT: normal, MMM Cardio: regular rate and rhythm, S1S2 heard, no murmurs appreciated Pulm: clear to auscultation bilaterally, no wheezes, rhonchi or rales; normal work of breathing on room air Extremities: warm, well perfused, No edema, cyanosis or clubbing; +2 pulses bilaterally MSK: normal gait and normal station  Assessment/ Plan: 39 y.o. male   1. Essential hypertension Not  at goal.  Add amlodipine 5 mg daily.  Asked to follow-up either in 2 weeks with RN to check blood pressure or patient may monitor at home daily and call with record in 2 weeks.  If still uncontrolled we will plan to increase to 10 mg but I think that given how borderline he is the amlodipine 5 mg daily should get him there.  Check fasting lipid panel, LFTs - CMP14+EGFR - Lipid Panel  2. Overweight with body mass index (BMI) 25.0-29.9 Working with bariatric medicine for weight loss.  Advised that phentermine can impact blood pressure. - CMP14+EGFR - Lipid Panel  3. Screening, anemia, deficiency, iron - CBC  4. Allergic rhinitis - azelastine (ASTELIN) 0.1 % nasal spray; Place 1 spray into both nostrils 2 (two) times daily.  Dispense: 30 mL; Refill: 12   Meds ordered this encounter  Medications  . azelastine (ASTELIN) 0.1 % nasal spray    Sig: Place 1 spray into both nostrils 2 (two) times daily.    Dispense:  30 mL    Refill:  12  . hydrochlorothiazide (HYDRODIURIL) 25 MG tablet    Sig: Take 1 tablet (25 mg total) by mouth daily.    Dispense:  90 tablet    Refill:  3    NTBS for med ckup, last visit 11/2017  . albuterol (VENTOLIN HFA) 108 (90 Base) MCG/ACT inhaler    Sig: 1-2 Puff(s) Via Inhaler Every 4-6 Hours PRN. Place on hold    Dispense:  8 g    Refill:  0  . fluticasone (FLONASE) 50 MCG/ACT nasal spray    Sig: Place 2 sprays into both nostrils daily.    Dispense:  16 g    Refill:  12  . montelukast (SINGULAIR) 10 MG tablet    Sig: Take 1 tablet (10 mg total) by mouth daily.    Dispense:  90 tablet    Refill:  3  . amLODipine (NORVASC) 5 MG tablet    Sig: Take 1 tablet (5 mg total) by mouth daily. For BP    Dispense:  90 tablet    Refill:  [place], [place] [phone]

## 2019-12-03 NOTE — Patient Instructions (Signed)
I have added amlodipine 5 mg daily to your hydrochlorathiazide for blood pressure.  Please monitor your blood pressures daily.  Goal blood pressures less than 140/90.  Contact our office or send a message through MyChart with your 2-week measurement of blood pressure.   How to Take Your Blood Pressure You can take your blood pressure at home with a machine. You may need to check your blood pressure at home:  To check if you have high blood pressure (hypertension).  To check your blood pressure over time.  To make sure your blood pressure medicine is working. Supplies needed: You will need a blood pressure machine, or monitor. You can buy one at a drugstore or online. When choosing one:  Choose one with an arm cuff.  Choose one that wraps around your upper arm. Only one finger should fit between your arm and the cuff.  Do not choose one that measures your blood pressure from your wrist or finger. Your doctor can suggest a monitor. How to prepare Avoid these things for 30 minutes before checking your blood pressure:  Drinking caffeine.  Drinking alcohol.  Eating.  Smoking.  Exercising. Five minutes before checking your blood pressure:  Pee.  Sit in a dining chair. Avoid sitting in a soft couch or armchair.  Be quiet. Do not talk. How to take your blood pressure Follow the instructions that came with your machine. If you have a digital blood pressure monitor, these may be the instructions: 1. Sit up straight. 2. Place your feet on the floor. Do not cross your ankles or legs. 3. Rest your left arm at the level of your heart. You may rest it on a table, desk, or chair. 4. Pull up your shirt sleeve. 5. Wrap the blood pressure cuff around the upper part of your left arm. The cuff should be 1 inch (2.5 cm) above your elbow. It is best to wrap the cuff around bare skin. 6. Fit the cuff snugly around your arm. You should be able to place only one finger between the cuff and your  arm. 7. Put the cord inside the groove of your elbow. 8. Press the power button. 9. Sit quietly while the cuff fills with air and loses air. 10. Write down the numbers on the screen. 11. Wait 2-3 minutes and then repeat steps 1-10. What do the numbers mean? Two numbers make up your blood pressure. The first number is called systolic pressure. The second is called diastolic pressure. An example of a blood pressure reading is "120 over 80" (or 120/80). If you are an adult and do not have a medical condition, use this guide to find out if your blood pressure is normal: Normal  First number: below 120.  Second number: below 80. Elevated  First number: 120-129.  Second number: below 80. Hypertension stage 1  First number: 130-139.  Second number: 80-89. Hypertension stage 2  First number: 140 or above.  Second number: 90 or above. Your blood pressure is above normal even if only the top or bottom number is above normal. Follow these instructions at home:  Check your blood pressure as often as your doctor tells you to.  Take your monitor to your next doctor's appointment. Your doctor will: ? Make sure you are using it correctly. ? Make sure it is working right.  Make sure you understand what your blood pressure numbers should be.  Tell your doctor if your medicines are causing side effects. Contact a doctor if:  Your blood pressure keeps being high. Get help right away if:  Your first blood pressure number is higher than 180.  Your second blood pressure number is higher than 120. This information is not intended to replace advice given to you by your health care provider. Make sure you discuss any questions you have with your health care provider. Document Revised: 07/21/2017 Document Reviewed: 01/15/2016 Elsevier Patient Education  2020 [employer].

## 2019-12-04 LAB — CMP14+EGFR
ALT: 69 IU/L — ABNORMAL HIGH (ref 0–44)
AST: 40 IU/L (ref 0–40)
Albumin/Globulin Ratio: 1.5 (ref 1.2–2.2)
Albumin: 4.5 g/dL (ref 4.0–5.0)
Alkaline Phosphatase: 77 IU/L (ref 39–117)
BUN/Creatinine Ratio: 14 (ref 9–20)
BUN: 14 mg/dL (ref 6–20)
Bilirubin Total: 0.5 mg/dL (ref 0.0–1.2)
CO2: 24 mmol/L (ref 20–29)
Calcium: 9.7 mg/dL (ref 8.7–10.2)
Chloride: 102 mmol/L (ref 96–106)
Creatinine, Ser: 1.02 mg/dL (ref 0.76–1.27)
GFR calc Af Amer: 107 mL/min/{1.73_m2} (ref >59)
GFR calc non Af Amer: 92 mL/min/{1.73_m2} (ref >59)
Globulin, Total: 3.1 g/dL (ref 1.5–4.5)
Glucose: 107 mg/dL — ABNORMAL HIGH (ref 65–99)
Potassium: 3.6 mmol/L (ref 3.5–5.2)
Sodium: 143 mmol/L (ref 134–144)
Total Protein: 7.6 g/dL (ref 6.0–8.5)

## 2019-12-04 LAB — CBC
Hematocrit: 46.1 % (ref 37.5–51.0)
Hemoglobin: 15.4 g/dL (ref 13.0–17.7)
MCH: 28.2 pg (ref 26.6–33.0)
MCHC: 33.4 g/dL (ref 31.5–35.7)
MCV: 84 fL (ref 79–97)
Platelets: 241 10*3/uL (ref 150–450)
RBC: 5.47 x10E6/uL (ref 4.14–5.80)
RDW: 12.3 % (ref 11.6–15.4)
WBC: 4.3 10*3/uL (ref 3.4–10.8)

## 2019-12-04 LAB — LIPID PANEL
Chol/HDL Ratio: 3.1 ratio (ref 0.0–5.0)
Cholesterol, Total: 104 mg/dL (ref 100–199)
HDL: 34 mg/dL — ABNORMAL LOW (ref >39)
LDL Chol Calc (NIH): 57 mg/dL (ref 0–99)
Triglycerides: 55 mg/dL (ref 0–149)
VLDL Cholesterol Cal: 13 mg/dL (ref 5–40)

## 2020-03-23 ENCOUNTER — Ambulatory Visit (INDEPENDENT_AMBULATORY_CARE_PROVIDER_SITE_OTHER): Payer: BC Managed Care – PPO | Admitting: Family Medicine

## 2020-03-23 DIAGNOSIS — B9689 Other specified bacterial agents as the cause of diseases classified elsewhere: Secondary | ICD-10-CM

## 2020-03-23 DIAGNOSIS — J208 Acute bronchitis due to other specified organisms: Secondary | ICD-10-CM

## 2020-03-23 MED ORDER — BENZONATATE 200 MG PO CAPS: 200.0000 mg | ORAL_CAPSULE | Freq: Two times a day (BID) | ORAL | 0 refills | Status: DC | PRN

## 2020-03-23 MED ORDER — AZITHROMYCIN 250 MG PO TABS: ORAL_TABLET | ORAL | 0 refills | Status: DC

## 2020-03-23 MED ORDER — GUAIFENESIN-CODEINE 100-10 MG/5ML PO SOLN: 5.0000 mL | Freq: Four times a day (QID) | ORAL | 0 refills | Status: DC | PRN

## 2020-03-23 NOTE — Progress Notes (Signed)
Telephone visit  Subjective: CC: URI PCP: [name], DO PJA:SNKNL D Holzman is a 39 y.o. male calls for telephone consult today. Patient provides verbal consent for consult held via phone.  Due to COVID-19 pandemic this visit was conducted virtually. This visit type was conducted due to national recommendations for restrictions regarding the COVID-19 Pandemic (e.g. social distancing, sheltering in place) in an effort to limit this patient's exposure and mitigate transmission in our community. All issues noted in this document were discussed and addressed.  A physical exam was not performed with this format.   Location of patient: home Location of provider: [facility] Others present for call: none  1. URI Onset 3 days ago.  Thought is was allergies.  He was in [place] for work.  He was around people for work.  He started with sinusitis/ rhinorrhea.  He started getting burning in his chest when he breathes in deep.  He reports coughing, post tussive emesis.  No fevers.  Mild myalgia (?dehydration).  No diarrhea, nausea.  He is using Tylenol, prescribed allergy meds.  He is vaccinated against COVID-19.   ROS: Per HPI  No Known Allergies Past Medical History:  Diagnosis Date  . Asthma    had asthma as a child  . Enlarged prostate   . Hypertension    hx of   . Inguinal hernia bilateral, non-recurrent     Current Outpatient Medications:  .  albuterol (VENTOLIN HFA) 108 (90 Base) MCG/ACT inhaler, 1-2 Puff(s) Via Inhaler Every 4-6 Hours PRN. Place on hold, Disp: 8 g, Rfl: 0 .  amLODipine (NORVASC) 5 MG tablet, Take 1 tablet (5 mg total) by mouth daily. For BP, Disp: 90 tablet, Rfl: 3 .  azelastine (ASTELIN) 0.1 % nasal spray, Place 1 spray into both nostrils 2 (two) times daily., Disp: 30 mL, Rfl: 12 .  fluticasone (FLONASE) 50 MCG/ACT nasal spray, Place 2 sprays into both nostrils daily., Disp: 16 g, Rfl: 12 .  hydrochlorothiazide (HYDRODIURIL) 25 MG tablet, Take 1 tablet (25 mg  total) by mouth daily., Disp: 90 tablet, Rfl: 3 .  montelukast (SINGULAIR) 10 MG tablet, Take 1 tablet (10 mg total) by mouth daily., Disp: 90 tablet, Rfl: 3 .  phentermine (ADIPEX-P) 37.5 MG tablet, Take 37.5 mg by mouth daily before breakfast., Disp: , Rfl:   Assessment/ Plan: 39 y.o. male   1. Acute bacterial bronchitis Going to empirically treat for bacterial infection given brown sputum and worsening of symptoms.  Nighttime cough medication sent as well as daytime cough medication.  Home care directions reviewed and reasons for return discussed.  He is going to get Covid tested despite being vaccinated against Covid given the new delta variant.  He will contact me with this result once available.  In the interim, I recommended that he stay out of work until symptoms are resolved - guaiFENesin-codeine 100-10 MG/5ML syrup; Take 5 mLs by mouth every 6 (six) hours as needed for cough (causes drowsiness).  Dispense: 120 mL; Refill: 0 - benzonatate (TESSALON) 200 MG capsule; Take 1 capsule (200 mg total) by mouth 2 (two) times daily as needed for cough.  Dispense: 20 capsule; Refill: 0 - azithromycin (ZITHROMAX) 250 MG tablet; Take 2 tablets today, then take 1 tablet daily until gone.  Dispense: 6 tablet; Refill: 0   Start time: 1:18pm End time: 1:25pm  Total time spent on patient care (including telephone call/ virtual visit): 11 minutes  Jhoselin Crume Hulen Skains, DO [facility] [phone]

## 2020-03-23 NOTE — Patient Instructions (Signed)

## 2020-03-24 ENCOUNTER — Telehealth: Payer: Self-pay | Admitting: Family Medicine

## 2020-03-24 ENCOUNTER — Other Ambulatory Visit: Payer: Self-pay | Admitting: Family Medicine

## 2020-03-24 MED ORDER — PREDNISONE 10 MG (21) PO TBPK: ORAL_TABLET | ORAL | 0 refills | Status: DC

## 2020-03-24 NOTE — Telephone Encounter (Signed)
Sent  would recommend if getting any worse from here on out, go to ED

## 2020-03-25 NOTE — Telephone Encounter (Signed)
Patient aware and verbalized understanding. °

## 2020-06-26 ENCOUNTER — Telehealth: Payer: BC Managed Care – PPO | Admitting: Physician Assistant

## 2020-06-26 DIAGNOSIS — J019 Acute sinusitis, unspecified: Secondary | ICD-10-CM | POA: Diagnosis not present

## 2020-06-26 MED ORDER — AMOXICILLIN-POT CLAVULANATE 875-125 MG PO TABS: 1.0000 | ORAL_TABLET | Freq: Two times a day (BID) | ORAL | 0 refills | Status: DC

## 2020-06-26 NOTE — Progress Notes (Signed)

## 2020-11-19 ENCOUNTER — Ambulatory Visit (INDEPENDENT_AMBULATORY_CARE_PROVIDER_SITE_OTHER): Payer: BC Managed Care – PPO

## 2020-11-19 ENCOUNTER — Ambulatory Visit: Payer: BC Managed Care – PPO | Admitting: Nurse Practitioner

## 2020-11-19 ENCOUNTER — Encounter: Payer: Self-pay | Admitting: Nurse Practitioner

## 2020-11-19 ENCOUNTER — Other Ambulatory Visit: Payer: Self-pay

## 2020-11-19 VITALS — BP 127/89 | HR 110 | Temp 99.3°F | Ht 68.0 in | Wt 191.2 lb

## 2020-11-19 DIAGNOSIS — R1084 Generalized abdominal pain: Secondary | ICD-10-CM

## 2020-11-19 DIAGNOSIS — R109 Unspecified abdominal pain: Secondary | ICD-10-CM | POA: Diagnosis not present

## 2020-11-19 NOTE — Patient Instructions (Signed)
Constipation, Adult Constipation is when a person has trouble pooping (having a bowel movement). When you have this condition, you may poop fewer than 3 times a week. Your poop (stool) may also be dry, hard, or bigger than normal. Follow these instructions at home: Eating and drinking  Eat foods that have a lot of fiber, such as: ? Fresh fruits and vegetables. ? Whole grains. ? Beans.  Eat less of foods that are low in fiber and high in fat and sugar, such as: ? French fries. ? Hamburgers. ? Cookies. ? Candy. ? Soda.  Drink enough fluid to keep your pee (urine) pale yellow.   General instructions  Exercise regularly or as told by your doctor. Try to do 150 minutes of exercise each week.  Go to the restroom when you feel like you need to poop. Do not hold it in.  Take over-the-counter and prescription medicines only as told by your doctor. These include any fiber supplements.  When you poop: ? Do deep breathing while relaxing your lower belly (abdomen). ? Relax your pelvic floor. The pelvic floor is a group of muscles that support the rectum, bladder, and intestines (as well as the uterus in women).  Watch your condition for any changes. Tell your doctor if you notice any.  Keep all follow-up visits as told by your doctor. This is important. Contact a doctor if:  You have pain that gets worse.  You have a fever.  You have not pooped for 4 days.  You vomit.  You are not hungry.  You lose weight.  You are bleeding from the opening of the butt (anus).  You have thin, pencil-like poop. Get help right away if:  You have a fever, and your symptoms suddenly get worse.  You leak poop or have blood in your poop.  Your belly feels hard or bigger than normal (bloated).  You have very bad belly pain.  You feel dizzy or you faint. Summary  Constipation is when a person poops fewer than 3 times a week, has trouble pooping, or has poop that is dry, hard, or bigger than  normal.  Eat foods that have a lot of fiber.  Drink enough fluid to keep your pee (urine) pale yellow.  Take over-the-counter and prescription medicines only as told by your doctor. These include any fiber supplements. This information is not intended to replace advice given to you by your health care provider. Make sure you discuss any questions you have with your health care provider. Document Revised: 06/26/2019 Document Reviewed: 06/26/2019 Elsevier Patient Education  2021 Elsevier Inc.  

## 2020-11-19 NOTE — Progress Notes (Signed)
   Subjective:    Patient ID: Mark Maddox, male    DOB: 04/25/1981, 40 y.o.   MRN: [id number]   Chief Complaint: Abdominal Pain   HPI Pt presents today with c/o abd pain starting 3 days ago. The pain is constant today 7/10. Pain is located above the bladder. He has been having some cramping. His BM's are runny to soft with lots of mucous. Takes fiber supplements daily. He has a hx of bilateral indirect inguinal hernia repairs in 2013. No complications with them.   He has a family hx of colorectal CA (PGF). Colonoscopy was normal with the exception of internal hemorrhoids. Denies recent fever, exposure to infectious disease, known food pathogens.   Review of Systems  Constitutional: Negative for chills, fatigue and fever.  Respiratory: Negative for cough, shortness of breath and wheezing.   Cardiovascular: Negative for chest pain.  Gastrointestinal: Positive for abdominal distention, abdominal pain, blood in stool and diarrhea.  Genitourinary: Negative for difficulty urinating, dysuria and hematuria.  Neurological: Negative for dizziness and headaches.         Objective:   Physical Exam Cardiovascular:     Rate and Rhythm: Normal rate and regular rhythm.     Heart sounds: Normal heart sounds.  Pulmonary:     Effort: Pulmonary effort is normal.  Abdominal:     General: Bowel sounds are normal. There is distension.     Tenderness: There is abdominal tenderness in the left lower quadrant. There is rebound.  Skin:    General: Skin is warm and dry.     Capillary Refill: Capillary refill takes less than 2 seconds.  Neurological:     Mental Status: He is alert and oriented to person, place, and time.  Psychiatric:        Mood and Affect: Mood normal.        Behavior: Behavior normal.   BP 127/89   Pulse (!) 110   Temp 99.3 F (37.4 C) (Temporal)   Ht 5\' 8"  (1.727 m)   Wt 191 lb 3.2 oz (86.7 kg)   SpO2 97%   BMI 29.07 kg/m     KUB- moderate stool burden-Preliminary  reading by , FNP  [facility]     Assessment & Plan:  [facility] comes in today with chief complaint of Abdominal Pain   Diagnosis and orders addressed:  1. Generalized abdominal pain Prune juice and milk of magnesia. Continue Miralax.  - DG Abd 1 View   Labs pending Health Maintenance reviewed Diet and exercise encouraged  Follow up plan: PRN  Rayburn Felt, RN, BSN, FNP-Student  Mary-Margaret Oretha Milch, FNP

## 2020-11-29 ENCOUNTER — Other Ambulatory Visit: Payer: Self-pay | Admitting: Family Medicine

## 2020-12-24 ENCOUNTER — Other Ambulatory Visit: Payer: Self-pay | Admitting: Family Medicine

## 2020-12-24 NOTE — Telephone Encounter (Signed)
[place]. NTBS 30 days given 11/30/20 

## 2020-12-25 NOTE — Telephone Encounter (Signed)
Called - pt aware to call back and sch appt for further refills

## 2020-12-29 ENCOUNTER — Telehealth: Payer: Self-pay

## 2021-03-03 ENCOUNTER — Other Ambulatory Visit: Payer: Self-pay

## 2021-03-03 ENCOUNTER — Ambulatory Visit: Payer: BC Managed Care – PPO | Admitting: Family Medicine

## 2021-03-03 ENCOUNTER — Encounter: Payer: Self-pay | Admitting: Family Medicine

## 2021-03-03 VITALS — BP 145/97 | HR 77 | Temp 96.4°F | Ht 68.0 in | Wt 191.2 lb

## 2021-03-03 DIAGNOSIS — N401 Enlarged prostate with lower urinary tract symptoms: Secondary | ICD-10-CM | POA: Diagnosis not present

## 2021-03-03 DIAGNOSIS — R739 Hyperglycemia, unspecified: Secondary | ICD-10-CM

## 2021-03-03 DIAGNOSIS — R3916 Straining to void: Secondary | ICD-10-CM

## 2021-03-03 DIAGNOSIS — Z0001 Encounter for general adult medical examination with abnormal findings: Secondary | ICD-10-CM | POA: Diagnosis not present

## 2021-03-03 DIAGNOSIS — Z0000 Encounter for general adult medical examination without abnormal findings: Secondary | ICD-10-CM

## 2021-03-03 DIAGNOSIS — I10 Essential (primary) hypertension: Secondary | ICD-10-CM

## 2021-03-03 DIAGNOSIS — J301 Allergic rhinitis due to pollen: Secondary | ICD-10-CM

## 2021-03-03 DIAGNOSIS — E663 Overweight: Secondary | ICD-10-CM | POA: Diagnosis not present

## 2021-03-03 LAB — BAYER DCA HB A1C WAIVED: HB A1C (BAYER DCA - WAIVED): 6 % (ref <7.0)

## 2021-03-03 MED ORDER — MONTELUKAST SODIUM 10 MG PO TABS: 10.0000 mg | ORAL_TABLET | Freq: Every day | ORAL | 3 refills | Status: DC

## 2021-03-03 MED ORDER — ALBUTEROL SULFATE HFA 108 (90 BASE) MCG/ACT IN AERS: INHALATION_SPRAY | RESPIRATORY_TRACT | 0 refills | Status: DC

## 2021-03-03 MED ORDER — AZELASTINE HCL 0.1 % NA SOLN: 1.0000 | Freq: Two times a day (BID) | NASAL | 3 refills | Status: DC

## 2021-03-03 MED ORDER — HYDROCHLOROTHIAZIDE 25 MG PO TABS: 25.0000 mg | ORAL_TABLET | Freq: Every day | ORAL | 3 refills | Status: DC

## 2021-03-03 MED ORDER — AMLODIPINE BESYLATE 10 MG PO TABS: 10.0000 mg | ORAL_TABLET | Freq: Every day | ORAL | 3 refills | Status: DC

## 2021-03-03 MED ORDER — FLUTICASONE PROPIONATE 50 MCG/ACT NA SUSP: 2.0000 | Freq: Every day | NASAL | 3 refills | Status: DC

## 2021-03-03 NOTE — Progress Notes (Signed)
Mark Maddox is a 40 y.o. male presents to office today for annual physical exam examination.    Concerns today include: 1.  Hypertension Patient reports compliance with hydrochlorothiazide 25 mg daily, Norvasc 5 mg daily but has not taken either these medications in several days.  He denies any chest pain, shortness of breath, edema or dizziness.  No headaches.  In fact he overall feels better since he has been off of medications for a while but apparently this includes his allergy medicines.  He notes that his blood pressure always stays uncontrolled despite medication use.  Occupation: works in [place]: fair, Exercise: active at work Immunizations needed: Immunization History  Administered Date(s) Administered   Praxair 01/04/2020, 02/18/2020   Tdap 11/24/2017     Past Medical History:  Diagnosis Date   Asthma    had asthma as a child   Enlarged prostate    Hypertension    hx of    Inguinal hernia bilateral, non-recurrent    Social History   Socioeconomic History   Marital status: Single    Spouse name: Not on file   Number of children: 0   Years of education: Not on file   Highest education level: Not on file  Occupational History   Occupation: truck driver  Tobacco Use   Smoking status: Never   Smokeless tobacco: Never  Vaping Use   Vaping Use: Never used  Substance and Sexual Activity   Alcohol use: No   Drug use: No   Sexual activity: Not on file  Other Topics Concern   Not on file  Social History Narrative   Not on file   Social Determinants of Health   Financial Resource Strain: Not on file  Food Insecurity: Not on file  Transportation Needs: Not on file  Physical Activity: Not on file  Stress: Not on file  Social Connections: Not on file  Intimate Partner Violence: Not on file   Past Surgical History:  Procedure Laterality Date   dental extractions     wisdom teeth   [place]  07/04/2012   Procedure: [place];  Surgeon: Ralene Ok, MD;  Location: [facility];  Service: General;  Laterality: Bilateral;   INSERTION OF MESH  07/04/2012   Procedure: INSERTION OF MESH;  Surgeon: Ralene Ok, MD;  Location: [facility];  Service: General;  Laterality: Bilateral;   Family History  Problem Relation Age of Onset   Diabetes Maternal Grandmother    Heart disease Maternal Grandfather        heart attack   Diabetes Maternal Grandfather    Prostate cancer Paternal Grandfather     Current Outpatient Medications:    albuterol (VENTOLIN HFA) 108 (90 Base) MCG/ACT inhaler, 1-2 Puff(s) Via Inhaler Every 4-6 Hours PRN. Place on hold, Disp: 8 g, Rfl: 0   amLODipine (NORVASC) 5 MG tablet, Take 1 tablet (5 mg total) by mouth daily. (NEEDS TO BE SEEN BEFORE NEXT REFILL), Disp: 30 tablet, Rfl: 0   azelastine (ASTELIN) 0.1 % nasal spray, Place 1 spray into both nostrils 2 (two) times daily., Disp: 30 mL, Rfl: 12   fluticasone (FLONASE) 50 MCG/ACT nasal spray, Place 2 sprays into both nostrils daily., Disp: 16 g, Rfl: 12   hydrochlorothiazide (HYDRODIURIL) 25 MG tablet, Take 1 tablet (25 mg total) by mouth daily. (NEEDS TO BE SEEN BEFORE NEXT REFILL), Disp: 30 tablet, Rfl: 0   montelukast (  SINGULAIR) 10 MG tablet, Take 1 tablet (10 mg total) by mouth daily. (NEEDS TO BE SEEN BEFORE NEXT REFILL), Disp: 30 tablet, Rfl: 0  No Known Allergies   ROS: Review of Systems Pertinent items noted in HPI and remainder of comprehensive ROS otherwise negative.    Physical exam BP (!) 145/97   Pulse 77   Temp (!) 96.4 F (35.8 C)   Ht 5' 8"  (1.727 m)   Wt 191 lb 3.2 oz (86.7 kg)   SpO2 95%   BMI 29.07 kg/m  General appearance: alert, cooperative, appears stated age, and no distress Head: Normocephalic, without obvious abnormality, atraumatic Eyes: negative findings: lids and lashes normal, conjunctivae and sclerae normal, corneas clear, and pupils  equal, round, reactive to light and accomodation Ears: normal TM's and external ear canals both ears Nose: Nares normal. Septum midline. Mucosa normal. No drainage or sinus tenderness. Throat: lips, mucosa, and tongue normal; teeth and gums normal Neck: no adenopathy, no carotid bruit, supple, symmetrical, trachea midline, and thyroid not enlarged, symmetric, no tenderness/mass/nodules Back: symmetric, no curvature. ROM normal. No CVA tenderness. Lungs: clear to auscultation bilaterally Chest wall: no tenderness Heart: regular rate and rhythm, S1, S2 normal, no murmur, click, rub or gallop Abdomen: soft, non-tender; bowel sounds normal; no masses,  no organomegaly Extremities: extremities normal, atraumatic, no cyanosis or edema Pulses: 2+ and symmetric Skin: Skin color, texture, turgor normal. No rashes or lesions Lymph nodes: Cervical, supraclavicular, and axillary nodes normal. Neurologic: Grossly normal    Assessment/ Plan: Mark Maddox here for annual physical exam.   Annual physical exam  Essential hypertension - Plan: CMP14+EGFR, Lipid panel, hydrochlorothiazide (HYDRODIURIL) 25 MG tablet, amLODipine (NORVASC) 10 MG tablet, Lipid panel, CMP14+EGFR  Benign prostatic hyperplasia (BPH) with straining on urination - Plan: PSA, PSA  Overweight with body mass index (BMI) 25.0-29.9  Elevated serum glucose - Plan: Bayer DCA Hb A1c Waived, Bayer DCA Hb A1c Waived  Seasonal allergic rhinitis due to pollen - Plan: fluticasone (FLONASE) 50 MCG/ACT nasal spray, montelukast (SINGULAIR) 10 MG tablet, azelastine (ASTELIN) 0.1 % nasal spray, albuterol (VENTOLIN HFA) 108 (90 Base) MCG/ACT inhaler  Blood pressure is not at goal but I believe this is because he is lapsed in his medications.  It sounds like it has not been well controlled despite antihypertensive and therefore I have advanced his Norvasc to 10 mg daily.  Check nonfasting lipid panel, CMP.  Check PSA  Actively working on  lifestyle modification to reduce weight  Check A1c given elevated serum glucose at last visit  Allergic rhinitis has been stable but did request refills on all allergy medicine  Counseled on healthy lifestyle choices, including diet (rich in fruits, vegetables and lean meats and low in salt and simple carbohydrates) and exercise (at least 30 minutes of moderate physical activity daily).  Patient to follow up in 1 year for annual exam or sooner if needed.  Kynisha Memon M. Lajuana Ripple, DO

## 2021-03-03 NOTE — Patient Instructions (Signed)
Preventive Care 40-41 Years Old, Male Preventive care refers to lifestyle choices and visits with your health care provider that can promote health and wellness. This includes: A yearly physical exam. This is also called an annual wellness visit. Regular dental and eye exams. Immunizations. Screening for certain conditions. Healthy lifestyle choices, such as: Eating a healthy diet. Getting regular exercise. Not using drugs or products that contain nicotine and tobacco. Limiting alcohol use. What can I expect for my preventive care visit? Physical exam Your health care provider will check your: Height and weight. These may be used to calculate your BMI (body mass index). BMI is a measurement that tells if you are at a healthy weight. Heart rate and blood pressure. Body temperature. Skin for abnormal spots. Counseling Your health care provider may ask you questions about your: Past medical problems. Family's medical history. Alcohol, tobacco, and drug use. Emotional well-being. Home life and relationship well-being. Sexual activity. Diet, exercise, and sleep habits. Work and work environment. Access to firearms. What immunizations do I need?  Vaccines are usually given at various ages, according to a schedule. Your health care provider will recommend vaccines for you based on your age, medicalhistory, and lifestyle or other factors, such as travel or where you work. What tests do I need? Blood tests Lipid and cholesterol levels. These may be checked every 5 years, or more often if you are over 50 years old. Hepatitis C test. Hepatitis B test. Screening Lung cancer screening. You may have this screening every year starting at age 55 if you have a 30-pack-year history of smoking and currently smoke or have quit within the past 15 years. Prostate cancer screening. Recommendations will vary depending on your family history and other risks. Genital exam to check for testicular cancer  or hernias. Colorectal cancer screening. All adults should have this screening starting at age 50 and continuing until age 75. Your health care provider may recommend screening at age 45 if you are at increased risk. You will have tests every 1-10 years, depending on your results and the type of screening test. Diabetes screening. This is done by checking your blood sugar (glucose) after you have not eaten for a while (fasting). You may have this done every 1-3 years. STD (sexually transmitted disease) testing, if you are at risk. Follow these instructions at home: Eating and drinking  Eat a diet that includes fresh fruits and vegetables, whole grains, lean protein, and low-fat dairy products. Take vitamin and mineral supplements as recommended by your health care provider. Do not drink alcohol if your health care provider tells you not to drink. If you drink alcohol: Limit how much you have to 0-2 drinks a day. Be aware of how much alcohol is in your drink. In the [place] one drink equals one 12 oz bottle of beer (355 mL), one 5 oz glass of wine (148 mL), or one 1 oz glass of hard liquor (44 mL).  Lifestyle Take daily care of your teeth and gums. Brush your teeth every morning and night with fluoride toothpaste. Floss one time each day. Stay active. Exercise for at least 30 minutes 5 or more days each week. Do not use any products that contain nicotine or tobacco, such as cigarettes, e-cigarettes, and chewing tobacco. If you need help quitting, ask your health care provider. Do not use drugs. If you are sexually active, practice safe sex. Use a condom or other form of protection to prevent STIs (sexually transmitted infections). If told by   your health care provider, take low-dose aspirin daily starting at age 50. Find healthy ways to cope with stress, such as: Meditation, yoga, or listening to music. Journaling. Talking to a trusted person. Spending time with friends and  family. Safety Always wear your seat belt while driving or riding in a vehicle. Do not drive: If you have been drinking alcohol. Do not ride with someone who has been drinking. When you are tired or distracted. While texting. Wear a helmet and other protective equipment during sports activities. If you have firearms in your house, make sure you follow all gun safety procedures. What's next? Go to your health care provider once a year for an annual wellness visit. Ask your health care provider how often you should have your eyes and teeth checked. Stay up to date on all vaccines. This information is not intended to replace advice given to you by your health care provider. Make sure you discuss any questions you have with your healthcare provider. Document Revised: 05/07/2019 Document Reviewed: 08/02/2018 Elsevier Patient Education  2022 Elsevier Inc.  

## 2021-03-04 LAB — CMP14+EGFR
ALT: 70 IU/L — ABNORMAL HIGH (ref 0–44)
AST: 70 IU/L — ABNORMAL HIGH (ref 0–40)
Albumin/Globulin Ratio: 1.2 (ref 1.2–2.2)
Albumin: 4.3 g/dL (ref 4.0–5.0)
Alkaline Phosphatase: 68 IU/L (ref 44–121)
BUN/Creatinine Ratio: 12 (ref 9–20)
BUN: 12 mg/dL (ref 6–24)
Bilirubin Total: 0.4 mg/dL (ref 0.0–1.2)
CO2: 24 mmol/L (ref 20–29)
Calcium: 9.4 mg/dL (ref 8.7–10.2)
Chloride: 100 mmol/L (ref 96–106)
Creatinine, Ser: 1 mg/dL (ref 0.76–1.27)
Globulin, Total: 3.7 g/dL (ref 1.5–4.5)
Glucose: 83 mg/dL (ref 65–99)
Potassium: 4 mmol/L (ref 3.5–5.2)
Sodium: 139 mmol/L (ref 134–144)
Total Protein: 8 g/dL (ref 6.0–8.5)
eGFR: 98 mL/min/{1.73_m2} (ref >59)

## 2021-03-04 LAB — LIPID PANEL
Chol/HDL Ratio: 5 ratio (ref 0.0–5.0)
Cholesterol, Total: 146 mg/dL (ref 100–199)
HDL: 29 mg/dL — ABNORMAL LOW (ref >39)
LDL Chol Calc (NIH): 92 mg/dL (ref 0–99)
Triglycerides: 142 mg/dL (ref 0–149)
VLDL Cholesterol Cal: 25 mg/dL (ref 5–40)

## 2021-03-04 LAB — PSA: Prostate Specific Ag, Serum: 0.9 ng/mL (ref 0.0–4.0)

## 2021-07-20 ENCOUNTER — Ambulatory Visit: Payer: BC Managed Care – PPO | Admitting: Nurse Practitioner

## 2021-07-20 ENCOUNTER — Encounter: Payer: Self-pay | Admitting: Nurse Practitioner

## 2021-07-20 VITALS — BP 158/102 | HR 95 | Temp 98.3°F | Resp 20 | Ht 68.0 in | Wt 196.0 lb

## 2021-07-20 DIAGNOSIS — J069 Acute upper respiratory infection, unspecified: Secondary | ICD-10-CM | POA: Insufficient documentation

## 2021-07-20 DIAGNOSIS — R6889 Other general symptoms and signs: Secondary | ICD-10-CM | POA: Insufficient documentation

## 2021-07-20 DIAGNOSIS — R509 Fever, unspecified: Secondary | ICD-10-CM | POA: Diagnosis not present

## 2021-07-20 DIAGNOSIS — I10 Essential (primary) hypertension: Secondary | ICD-10-CM

## 2021-07-20 MED ORDER — PSEUDOEPH-BROMPHEN-DM 30-2-10 MG/5ML PO SYRP: 5.0000 mL | ORAL_SOLUTION | Freq: Four times a day (QID) | ORAL | 0 refills | Status: DC | PRN

## 2021-07-20 NOTE — Progress Notes (Signed)
Acute Office Visit  Subjective:    Patient ID: Mark Maddox, male    DOB: 10/15/1980, 40 y.o.   MRN: [id number]  Chief Complaint  Patient presents with   Sinusitis    Congestion, swollen throat, recent fever/chills     Sinusitis This is a new problem. The current episode started yesterday. The problem is unchanged. The maximum temperature recorded prior to his arrival was 100.4 - 100.9 F. Associated symptoms include congestion, coughing, headaches and swollen glands. Past treatments include nothing.  URI  This is a new problem. The current episode started yesterday. The problem has been gradually worsening. There has been no fever. Associated symptoms include congestion, coughing, headaches, sinus pain and swollen glands. Pertinent negatives include no diarrhea. He has tried nothing for the symptoms. The treatment provided no relief.   Concerning patient's hypertension he is not currently taking any of his medications, amlodipine 10 mg tablet by mouth daily and hydrochlorothiazide 25 mg tablet by mouth daily.  Patient reports that he is not having any signs and symptoms of hypertension.  He tries to maintain a healthy diet and follow-up as directed.  Current blood pressure 158/102, repeat 155/100.  Past Medical History:  Diagnosis Date   Asthma    had asthma as a child   Enlarged prostate    Hypertension    hx of    Inguinal hernia bilateral, non-recurrent     Past Surgical History:  Procedure Laterality Date   dental extractions     wisdom teeth   HERNIA REPAIR     INGUINAL HERNIA REPAIR  07/04/2012   Procedure: LAPAROSCOPIC BILATERAL INGUINAL HERNIA REPAIR;  Surgeon: Ralene Ok, MD;  Location: [facility];  Service: General;  Laterality: Bilateral;   INSERTION OF MESH  07/04/2012   Procedure: INSERTION OF MESH;  Surgeon: Ralene Ok, MD;  Location: [facility];  Service: General;  Laterality: Bilateral;    Family History  Problem Relation Age of Onset   Diabetes Maternal  Grandmother    Heart disease Maternal Grandfather        heart attack   Diabetes Maternal Grandfather    Prostate cancer Paternal Grandfather     Social History   Socioeconomic History   Marital status: Single    Spouse name: Not on file   Number of children: 0   Years of education: Not on file   Highest education level: Not on file  Occupational History   Occupation: truck driver  Tobacco Use   Smoking status: Never   Smokeless tobacco: Never  Vaping Use   Vaping Use: Never used  Substance and Sexual Activity   Alcohol use: No   Drug use: No   Sexual activity: Not on file  Other Topics Concern   Not on file  Social History Narrative   Not on file   Social Determinants of Health   Financial Resource Strain: Not on file  Food Insecurity: Not on file  Transportation Needs: Not on file  Physical Activity: Not on file  Stress: Not on file  Social Connections: Not on file  Intimate Partner Violence: Not on file    Outpatient Medications Prior to Visit  Medication Sig Dispense Refill   azelastine (ASTELIN) 0.1 % nasal spray Place 1 spray into both nostrils 2 (two) times daily. 90 mL 3   fluticasone (FLONASE) 50 MCG/ACT nasal spray Place 2 sprays into both nostrils daily. 48 g 3   amLODipine (NORVASC) 10 MG tablet Take 1 tablet (10 mg  total) by mouth daily. (Patient not taking: Reported on 07/20/2021) 90 tablet 3   hydrochlorothiazide (HYDRODIURIL) 25 MG tablet Take 1 tablet (25 mg total) by mouth daily. (Patient not taking: Reported on 07/20/2021) 90 tablet 3   montelukast (SINGULAIR) 10 MG tablet Take 1 tablet (10 mg total) by mouth daily. (Patient not taking: Reported on 07/20/2021) 90 tablet 3   albuterol (VENTOLIN HFA) 108 (90 Base) MCG/ACT inhaler 1-2 Puff(s) Via Inhaler Every 4-6 Hours PRN. Place on hold 8 g 0   No facility-administered medications prior to visit.    No Known Allergies  Review of Systems  HENT:  Positive for congestion and sinus pain.    Respiratory:  Positive for cough.   Gastrointestinal:  Negative for diarrhea.  Neurological:  Positive for headaches.      Objective:    Physical Exam  BP (!) 158/102   Pulse 95   Temp 98.3 F (36.8 C)   Resp 20   Ht 5' 8"  (1.727 m)   Wt 196 lb (88.9 kg)   SpO2 95%   BMI 29.80 kg/m  Wt Readings from Last 3 Encounters:  07/20/21 196 lb (88.9 kg)  03/03/21 191 lb 3.2 oz (86.7 kg)  11/19/20 191 lb 3.2 oz (86.7 kg)    Health Maintenance Due  Topic Date Due   COVID-19 Vaccine (3 - Booster for Moderna series) 04/14/2020   INFLUENZA VACCINE  Never done    There are no preventive care reminders to display for this patient.   Lab Results  Component Value Date   TSH 0.87 01/20/2017   Lab Results  Component Value Date   WBC 4.3 12/03/2019   HGB 15.4 12/03/2019   HCT 46.1 12/03/2019   MCV 84 12/03/2019   PLT 241 12/03/2019   Lab Results  Component Value Date   NA 139 03/03/2021   K 4.0 03/03/2021   CO2 24 03/03/2021   GLUCOSE 83 03/03/2021   BUN 12 03/03/2021   CREATININE 1.00 03/03/2021   BILITOT 0.4 03/03/2021   ALKPHOS 68 03/03/2021   AST 70 (H) 03/03/2021   ALT 70 (H) 03/03/2021   PROT 8.0 03/03/2021   ALBUMIN 4.3 03/03/2021   CALCIUM 9.4 03/03/2021   EGFR 98 03/03/2021   Lab Results  Component Value Date   CHOL 146 03/03/2021   Lab Results  Component Value Date   HDL 29 (L) 03/03/2021   Lab Results  Component Value Date   LDLCALC 92 03/03/2021   Lab Results  Component Value Date   TRIG 142 03/03/2021   Lab Results  Component Value Date   CHOLHDL 5.0 03/03/2021   Lab Results  Component Value Date   HGBA1C 6.0 03/03/2021       Assessment & Plan:   Problem List Items Addressed This Visit       Cardiovascular and Mediastinum   Essential hypertension    Blood pressure not well controlled.  Patient is not taking blood pressure medication and not checking blood pressure readings at home at this time.  Provided education to patient on  the need and importance of maintaining a low blood pressure for stroke and disease prevention.  I emphasized exercise, low-sodium diet and taking medication as prescribed.  Patient verbalized understanding and knows to follow-up as directed.          Respiratory   Upper respiratory tract infection    Upper respiratory symptoms not well controlled in the past 24 to 36 hours.. Take meds as prescribed -  Use a cool mist humidifier  -Use saline nose sprays frequently -Force fluids -For fever or aches or pains- take Tylenol or ibuprofen. -Completed flu, COVID, RSV swab with results pending. -Started Bromfed for cold and cough symptoms. Follow up with worsening unresolved symptoms, education provided Printed handouts given.  Rx sent to pharmacy.        Other   Flu-like symptoms - Primary   Relevant Medications   brompheniramine-pseudoephedrine-DM 30-2-10 MG/5ML syrup   Other Relevant Orders   COVID-19, Flu A+B and RSV   Other Visit Diagnoses     Fever, unspecified fever cause       Relevant Orders   COVID-19, Flu A+B and RSV        Meds ordered this encounter  Medications   brompheniramine-pseudoephedrine-DM 30-2-10 MG/5ML syrup    Sig: Take 5 mLs by mouth 4 (four) times daily as needed.    Dispense:  120 mL    Refill:  0    Order Specific Question:   Supervising Provider    Answer:   Claretta Fraise [[id number]]     Ivy Lynn, NP

## 2021-07-20 NOTE — Patient Instructions (Signed)

## 2021-07-20 NOTE — Assessment & Plan Note (Signed)
Upper respiratory symptoms not well controlled in the past 24 to 36 hours.. Take meds as prescribed - Use a cool mist humidifier  -Use saline nose sprays frequently -Force fluids -For fever or aches or pains- take Tylenol or ibuprofen. -Completed flu, COVID, RSV swab with results pending. -Started Bromfed for cold and cough symptoms. Follow up with worsening unresolved symptoms, education provided Printed handouts given.  Rx sent to pharmacy.

## 2021-07-20 NOTE — Assessment & Plan Note (Signed)
Blood pressure not well controlled.  Patient is not taking blood pressure medication and not checking blood pressure readings at home at this time.  Provided education to patient on the need and importance of maintaining a low blood pressure for stroke and disease prevention.  I emphasized exercise, low-sodium diet and taking medication as prescribed.  Patient verbalized understanding and knows to follow-up as directed.

## 2021-07-21 LAB — COVID-19, FLU A+B AND RSV
Influenza A, NAA: NOT DETECTED
Influenza B, NAA: NOT DETECTED
RSV, NAA: NOT DETECTED
SARS-CoV-2, NAA: NOT DETECTED

## 2021-09-27 ENCOUNTER — Ambulatory Visit: Payer: BC Managed Care – PPO | Admitting: Family Medicine

## 2021-09-27 ENCOUNTER — Encounter: Payer: Self-pay | Admitting: Family Medicine

## 2021-09-27 VITALS — BP 161/117 | HR 96 | Temp 98.1°F | Ht 68.0 in | Wt 198.4 lb

## 2021-09-27 DIAGNOSIS — N6459 Other signs and symptoms in breast: Secondary | ICD-10-CM

## 2021-09-27 DIAGNOSIS — I781 Nevus, non-neoplastic: Secondary | ICD-10-CM

## 2021-09-27 DIAGNOSIS — I10 Essential (primary) hypertension: Secondary | ICD-10-CM | POA: Diagnosis not present

## 2021-09-27 MED ORDER — LISINOPRIL 10 MG PO TABS: 10.0000 mg | ORAL_TABLET | Freq: Every day | ORAL | 3 refills | Status: DC

## 2021-09-27 NOTE — Progress Notes (Signed)
Subjective: CC: Hypertension PCP: Janora Norlander, DO [id number] Mark Maddox is a 41 y.o. male presenting to clinic today for:  1.  Hypertension Patient has not been compliant with Norvasc nor hydrochlorothiazide.  He notes he discontinued this because he just felt unwell while taking.  He denies any chest pain, shortness of breath, headache or visual disturbance.  Does not follow a strict diet  2.  Spider veins Patient with spider veins on the cheeks bilaterally.  He feels like it makes his cheeks look too rosy and he is "going through midlife crisis" and wants to have some stuff done.  He denies any alcohol use.  He has a history of fatty liver disease that has been present since he was a teenager.  3.  Excessive breast tissue Patient reports he has excessive fattiness of the chest which makes him feel like he has "man boobs" and he wants to have these removed.  He is interested in pectoral implants and saw that Dr. Vernona Rieger with [facility] in [place] offers this.  He would like to get a referral today.  Denies any marijuana use.  No reports of testicular shrinkage.  No exogenous testosterone use.  Breast tissue has been present independent of weight loss or weight gain   ROS: Per HPI  No Known Allergies Past Medical History:  Diagnosis Date   Asthma    had asthma as a child   Enlarged prostate    Hypertension    hx of    Inguinal hernia bilateral, non-recurrent     Current Outpatient Medications:    amLODipine (NORVASC) 10 MG tablet, Take 1 tablet (10 mg total) by mouth daily. (Patient not taking: Reported on 07/20/2021), Disp: 90 tablet, Rfl: 3   azelastine (ASTELIN) 0.1 % nasal spray, Place 1 spray into both nostrils 2 (two) times daily. (Patient not taking: Reported on 09/27/2021), Disp: 90 mL, Rfl: 3   fluticasone (FLONASE) 50 MCG/ACT nasal spray, Place 2 sprays into both nostrils daily. (Patient not taking: Reported on 09/27/2021), Disp: 48 g, Rfl: 3    hydrochlorothiazide (HYDRODIURIL) 25 MG tablet, Take 1 tablet (25 mg total) by mouth daily. (Patient not taking: Reported on 07/20/2021), Disp: 90 tablet, Rfl: 3   montelukast (SINGULAIR) 10 MG tablet, Take 1 tablet (10 mg total) by mouth daily. (Patient not taking: Reported on 07/20/2021), Disp: 90 tablet, Rfl: 3 Social History   Socioeconomic History   Marital status: Single    Spouse name: Not on file   Number of children: 0   Years of education: Not on file   Highest education level: Not on file  Occupational History   Occupation: truck driver  Tobacco Use   Smoking status: Never   Smokeless tobacco: Never  Vaping Use   Vaping Use: Never used  Substance and Sexual Activity   Alcohol use: No   Drug use: No   Sexual activity: Not on file  Other Topics Concern   Not on file  Social History Narrative   Not on file   Social Determinants of Health   Financial Resource Strain: Not on file  Food Insecurity: Not on file  Transportation Needs: Not on file  Physical Activity: Not on file  Stress: Not on file  Social Connections: Not on file  Intimate Partner Violence: Not on file   Family History  Problem Relation Age of Onset   Diabetes Maternal Grandmother    Heart disease Maternal Grandfather        heart  attack   Diabetes Maternal Grandfather    Prostate cancer Paternal Grandfather     Objective: Office vital signs reviewed. BP (!) 161/117    Pulse 96    Temp 98.1 F (36.7 C)    Ht 5\' 8"  (1.727 m)    Wt 198 lb 6.4 oz (90 kg)    SpO2 97%    BMI 30.17 kg/m   Physical Examination:  General: Awake, alert, well nourished, No acute distress HEENT: Very fine spider veins appreciated along the cheeks. Cardio: regular rate and rhythm  Pulm:  normal work of breathing on room air Chest: Fatty tissue appreciated within the chest but no glandular tissue to suggest gynecomastia  Assessment/ Plan: 41 y.o. male   Essential hypertension - Plan: lisinopril (ZESTRIL) 10 MG  tablet, Basic metabolic panel  Spider veins - Plan: Ambulatory referral to Dermatology  Breast symptom or sign in male - Plan: Ambulatory referral to Plastic Surgery  Blood pressure is not controlled.  Apparently had some intolerance/fatigue with HCTZ/Norvasc.  We will try lisinopril instead.  Rx sent.  Will come in for BMP and recheck of BP in 1 week  The spider veins on the face do not appear pathologic.  Likely would benefit from laser resurfacing.  Referral to dermatology placed  Wants to have fatty tissue in the chest removed and pectoral implants placed.  Referral to plastic surgery placed  No orders of the defined types were placed in this encounter.  No orders of the defined types were placed in this encounter.    Janora Norlander, DO [place] [phone]

## 2021-10-01 ENCOUNTER — Telehealth: Payer: Self-pay | Admitting: Family Medicine

## 2021-10-01 NOTE — Telephone Encounter (Signed)
Monica called from [facility]. Dr there does not treat spider vein in the face and pt will most likely need laser treatment. Dr will gladly see pt but will not be able to treat. Dr is referral to [facility] in [place] who does treat pt dx. Phone number [phone].

## 2021-10-04 NOTE — Telephone Encounter (Signed)
Can you adjust referral?

## 2021-10-17 ENCOUNTER — Encounter: Payer: Self-pay | Admitting: Family Medicine

## 2022-02-15 DIAGNOSIS — E860 Dehydration: Secondary | ICD-10-CM | POA: Diagnosis not present

## 2022-02-15 DIAGNOSIS — S0990XA Unspecified injury of head, initial encounter: Secondary | ICD-10-CM | POA: Diagnosis not present

## 2022-02-15 DIAGNOSIS — N179 Acute kidney failure, unspecified: Secondary | ICD-10-CM | POA: Diagnosis not present

## 2022-02-15 DIAGNOSIS — R55 Syncope and collapse: Secondary | ICD-10-CM | POA: Diagnosis not present

## 2022-02-15 DIAGNOSIS — S3992XA Unspecified injury of lower back, initial encounter: Secondary | ICD-10-CM | POA: Diagnosis not present

## 2022-02-15 DIAGNOSIS — E872 Acidosis, unspecified: Secondary | ICD-10-CM | POA: Diagnosis not present

## 2022-02-15 DIAGNOSIS — I10 Essential (primary) hypertension: Secondary | ICD-10-CM | POA: Diagnosis not present

## 2022-02-15 DIAGNOSIS — S299XXA Unspecified injury of thorax, initial encounter: Secondary | ICD-10-CM | POA: Diagnosis not present

## 2022-02-15 DIAGNOSIS — K529 Noninfective gastroenteritis and colitis, unspecified: Secondary | ICD-10-CM | POA: Diagnosis not present

## 2022-02-16 DIAGNOSIS — E872 Acidosis, unspecified: Secondary | ICD-10-CM | POA: Diagnosis not present

## 2022-02-16 DIAGNOSIS — R109 Unspecified abdominal pain: Secondary | ICD-10-CM | POA: Diagnosis not present

## 2022-02-16 DIAGNOSIS — E860 Dehydration: Secondary | ICD-10-CM | POA: Diagnosis not present

## 2022-02-16 DIAGNOSIS — M25552 Pain in left hip: Secondary | ICD-10-CM | POA: Diagnosis not present

## 2022-02-16 DIAGNOSIS — K529 Noninfective gastroenteritis and colitis, unspecified: Secondary | ICD-10-CM | POA: Diagnosis not present

## 2022-02-16 DIAGNOSIS — N179 Acute kidney failure, unspecified: Secondary | ICD-10-CM | POA: Diagnosis not present

## 2022-02-16 DIAGNOSIS — M545 Low back pain, unspecified: Secondary | ICD-10-CM | POA: Diagnosis not present

## 2022-02-16 DIAGNOSIS — R509 Fever, unspecified: Secondary | ICD-10-CM | POA: Diagnosis not present

## 2022-02-17 ENCOUNTER — Telehealth: Payer: Self-pay | Admitting: Family Medicine

## 2022-02-17 ENCOUNTER — Encounter: Payer: Self-pay | Admitting: Family Medicine

## 2022-02-17 DIAGNOSIS — K529 Noninfective gastroenteritis and colitis, unspecified: Secondary | ICD-10-CM | POA: Diagnosis not present

## 2022-02-17 DIAGNOSIS — Z20822 Contact with and (suspected) exposure to covid-19: Secondary | ICD-10-CM | POA: Diagnosis not present

## 2022-02-17 DIAGNOSIS — K760 Fatty (change of) liver, not elsewhere classified: Secondary | ICD-10-CM | POA: Diagnosis not present

## 2022-02-17 DIAGNOSIS — I10 Essential (primary) hypertension: Secondary | ICD-10-CM | POA: Diagnosis not present

## 2022-02-17 DIAGNOSIS — E860 Dehydration: Secondary | ICD-10-CM | POA: Diagnosis not present

## 2022-02-17 DIAGNOSIS — N179 Acute kidney failure, unspecified: Secondary | ICD-10-CM | POA: Diagnosis not present

## 2022-02-17 DIAGNOSIS — E872 Acidosis, unspecified: Secondary | ICD-10-CM | POA: Diagnosis not present

## 2022-02-17 NOTE — Telephone Encounter (Signed)
Appointment has already been scheduled on 02/24/22 with Kari Baars.

## 2022-02-24 ENCOUNTER — Encounter: Payer: Self-pay | Admitting: Family Medicine

## 2022-02-24 ENCOUNTER — Ambulatory Visit: Payer: BC Managed Care – PPO | Admitting: Family Medicine

## 2022-02-24 VITALS — BP 117/74 | HR 92 | Temp 98.7°F | Ht 68.0 in | Wt 183.0 lb

## 2022-02-24 DIAGNOSIS — Z09 Encounter for follow-up examination after completed treatment for conditions other than malignant neoplasm: Secondary | ICD-10-CM | POA: Diagnosis not present

## 2022-02-24 DIAGNOSIS — N179 Acute kidney failure, unspecified: Secondary | ICD-10-CM

## 2022-02-24 DIAGNOSIS — A049 Bacterial intestinal infection, unspecified: Secondary | ICD-10-CM

## 2022-02-24 DIAGNOSIS — M62830 Muscle spasm of back: Secondary | ICD-10-CM | POA: Diagnosis not present

## 2022-02-24 MED ORDER — CYCLOBENZAPRINE HCL 10 MG PO TABS
10.0000 mg | ORAL_TABLET | Freq: Three times a day (TID) | ORAL | 0 refills | Status: DC | PRN
Start: 2022-02-24 — End: 2022-04-29

## 2022-02-24 NOTE — Progress Notes (Signed)
Subjective:  Patient ID: Mark Maddox, male    DOB: 1981/03/29, 41 y.o.   MRN: [id number]  Patient Care Team: Janora Norlander, DO as PCP - General (Family Medicine)   Chief Complaint:  Hospitalization Follow-up (Food poisoning,acute kidney failure)   HPI: Mark Maddox is a 41 y.o. male presenting on 02/24/2022 for Hospitalization Follow-up (Food poisoning,acute kidney failure)   Pt presents today for hospital discharge follow up. States he was out of town and ate at [employer]. States he then developed significant vomiting and diarrhea which lasted for more than 8 hours. He was admitted to the hospital for acute kidney injury, food poisoning, and bacterial gastroenteritis. He had to stay in the hospital for 4 days for IV hydration and IV antibiotics. States the reason he ended up in the hospital is because he got up to go to the restroom again and passed out. States when he fell, he injured his back. Reports all imaging in the hospital was unremarkable. He states he continues to have left sided lower back pain, worse with certain movements and palpation. He states stools are back to normal and n/v has resolved. Urine output normal. Does still feel slightly worn out but overall better.     Relevant past medical, surgical, family, and social history reviewed and updated as indicated.  Allergies and medications reviewed and updated. Data reviewed: Chart in Epic.   Past Medical History:  Diagnosis Date   Asthma    had asthma as a child   Enlarged prostate    Hypertension    hx of    Inguinal hernia bilateral, non-recurrent     Past Surgical History:  Procedure Laterality Date   dental extractions     wisdom teeth   HERNIA REPAIR     INGUINAL HERNIA REPAIR  07/04/2012   Procedure: LAPAROSCOPIC BILATERAL INGUINAL HERNIA REPAIR;  Surgeon: Ralene Ok, MD;  Location: [facility];  Service: General;  Laterality: Bilateral;   INSERTION OF MESH  07/04/2012   Procedure:  INSERTION OF MESH;  Surgeon: Ralene Ok, MD;  Location: [facility];  Service: General;  Laterality: Bilateral;    Social History   Socioeconomic History   Marital status: Single    Spouse name: Not on file   Number of children: 0   Years of education: Not on file   Highest education level: Not on file  Occupational History   Occupation: truck driver  Tobacco Use   Smoking status: Never   Smokeless tobacco: Never  Vaping Use   Vaping Use: Never used  Substance and Sexual Activity   Alcohol use: No   Drug use: No   Sexual activity: Not on file  Other Topics Concern   Not on file  Social History Narrative   Not on file   Social Determinants of Health   Financial Resource Strain: Not on file  Food Insecurity: Not on file  Transportation Needs: Not on file  Physical Activity: Not on file  Stress: Not on file  Social Connections: Not on file  Intimate Partner Violence: Not on file    Outpatient Encounter Medications as of 02/24/2022  Medication Sig   amLODipine (NORVASC) 10 MG tablet Take 10 mg by mouth daily.   cyclobenzaprine (FLEXERIL) 10 MG tablet Take 1 tablet (10 mg total) by mouth 3 (three) times daily as needed for muscle spasms.   hydrochlorothiazide (HYDRODIURIL) 25 MG tablet Take 25 mg by mouth daily.   [DISCONTINUED] lisinopril (ZESTRIL) 10  MG tablet Take 1 tablet (10 mg total) by mouth daily.   No facility-administered encounter medications on file as of 02/24/2022.    No Known Allergies  Review of Systems  Constitutional:  Positive for fatigue. Negative for activity change, appetite change, chills, diaphoresis, fever and unexpected weight change.  HENT: Negative.    Eyes: Negative.  Negative for photophobia and visual disturbance.  Respiratory:  Negative for cough, chest tightness and shortness of breath.   Cardiovascular:  Negative for chest pain, palpitations and leg swelling.  Gastrointestinal:  Negative for abdominal distention, abdominal pain, anal  bleeding, blood in stool, constipation, diarrhea, nausea, rectal pain and vomiting.  Endocrine: Negative.   Genitourinary:  Negative for decreased urine volume, difficulty urinating, dysuria, frequency and urgency.  Musculoskeletal:  Positive for back pain and myalgias. Negative for arthralgias, gait problem, joint swelling, neck pain and neck stiffness.  Skin: Negative.   Allergic/Immunologic: Negative.   Neurological:  Negative for dizziness, tremors, seizures, syncope, facial asymmetry, speech difficulty, weakness, light-headedness, numbness and headaches.  Hematological: Negative.   Psychiatric/Behavioral:  Negative for confusion, hallucinations, sleep disturbance and suicidal ideas.   All other systems reviewed and are negative.       Objective:  BP 117/74   Pulse 92   Temp 98.7 F (37.1 C)   Ht 5' 8"  (1.727 m)   Wt 183 lb (83 kg)   SpO2 97%   BMI 27.83 kg/m    Wt Readings from Last 3 Encounters:  02/24/22 183 lb (83 kg)  09/27/21 198 lb 6.4 oz (90 kg)  07/20/21 196 lb (88.9 kg)    Physical Exam Vitals and nursing note reviewed.  Constitutional:      General: He is not in acute distress.    Appearance: Normal appearance. He is well-developed and well-groomed. He is not ill-appearing, toxic-appearing or diaphoretic.  HENT:     Head: Normocephalic and atraumatic.     Jaw: There is normal jaw occlusion.     Right Ear: Hearing normal.     Left Ear: Hearing normal.     Nose: Nose normal.     Mouth/Throat:     Lips: Pink.     Mouth: Mucous membranes are moist.     Pharynx: Uvula midline.  Eyes:     General: Lids are normal.     Pupils: Pupils are equal, round, and reactive to light.  Neck:     Thyroid: No thyroid mass, thyromegaly or thyroid tenderness.     Vascular: No carotid bruit or JVD.     Trachea: Trachea and phonation normal.  Cardiovascular:     Rate and Rhythm: Normal rate and regular rhythm.     Chest Wall: PMI is not displaced.     Pulses: Normal  pulses.     Heart sounds: Normal heart sounds. No murmur Maddox.    No friction rub. No gallop.  Pulmonary:     Effort: Pulmonary effort is normal.     Breath sounds: Normal breath sounds.  Abdominal:     General: Bowel sounds are normal. There is no abdominal bruit.     Palpations: Abdomen is soft. There is no hepatomegaly or splenomegaly.     Tenderness: There is no abdominal tenderness.  Musculoskeletal:        General: Normal range of motion.     Cervical back: Normal range of motion and neck supple.     Thoracic back: Normal.     Lumbar back: Spasms and tenderness present. No  swelling, edema, deformity, signs of trauma, lacerations or bony tenderness. Normal range of motion. Negative right straight leg raise test and negative left straight leg raise test. No scoliosis.       Back:     Right lower leg: No edema.     Left lower leg: No edema.  Lymphadenopathy:     Cervical: No cervical adenopathy.  Skin:    General: Skin is warm and dry.     Capillary Refill: Capillary refill takes less than 2 seconds.     Coloration: Skin is not cyanotic, jaundiced or pale.     Findings: No rash.  Neurological:     General: No focal deficit present.     Mental Status: He is alert and oriented to person, place, and time.     Sensory: Sensation is intact.     Motor: Motor function is intact.     Coordination: Coordination is intact.     Gait: Gait is intact.     Deep Tendon Reflexes: Reflexes are normal and symmetric.  Psychiatric:        Attention and Perception: Attention and perception normal.        Mood and Affect: Mood and affect normal.        Speech: Speech normal.        Behavior: Behavior normal. Behavior is cooperative.        Thought Content: Thought content normal.        Cognition and Memory: Cognition and memory normal.        Judgment: Judgment normal.     Results for orders placed or performed in visit on 07/20/21  COVID-19, Flu A+B and RSV   Specimen:  Nasopharyngeal(NP) swabs in vial transport medium  Result Value Ref Range   SARS-CoV-2, NAA Not Detected Not Detected   Influenza A, NAA Not Detected Not Detected   Influenza B, NAA Not Detected Not Detected   RSV, NAA Not Detected Not Detected   Test Information: Comment        Pertinent labs & imaging results that were available during my care of the patient were reviewed by me and considered in my medical decision making.  Assessment & Plan:  Mark Maddox was seen today for hospitalization follow-up.  Diagnoses and all orders for this visit:  Hospital discharge follow-up Bacterial infection of gastrointestinal tract AKI (acute kidney injury) ([place]) Doing well overall, will repeat labs. N/V/D has resolved. Hospital records have not been received but pt reports he did request them.  -     CBC with Differential/Platelet -     CMP14+EGFR  Spasm of muscle of lower back Symptomatic care discussed in detail. Will treat with short course of muscle relaxers. Report new, worsening, or persistent symptoms.  -     cyclobenzaprine (FLEXERIL) 10 MG tablet; Take 1 tablet (10 mg total) by mouth 3 (three) times daily as needed for muscle spasms.     Continue all other maintenance medications.  Follow up plan: Return if symptoms worsen or fail to improve.   Continue healthy lifestyle choices, including diet (rich in fruits, vegetables, and lean proteins, and low in salt and simple carbohydrates) and exercise (at least 30 minutes of moderate physical activity daily).  Educational handout given for muscle spasm  The above assessment and management plan was discussed with the patient. The patient verbalized understanding of and has agreed to the management plan. Patient is aware to call the clinic if they develop any new symptoms or if symptoms  persist or worsen. Patient is aware when to return to the clinic for a follow-up visit. Patient educated on when it is appropriate to go to the emergency  department.   Monia Pouch, FNP-C [facility] [phone]

## 2022-02-25 ENCOUNTER — Other Ambulatory Visit: Payer: Self-pay | Admitting: Family Medicine

## 2022-02-25 DIAGNOSIS — R7989 Other specified abnormal findings of blood chemistry: Secondary | ICD-10-CM

## 2022-02-25 LAB — CMP14+EGFR
ALT: 122 IU/L — ABNORMAL HIGH (ref 0–44)
AST: 146 IU/L — ABNORMAL HIGH (ref 0–40)
Albumin/Globulin Ratio: 0.9 — ABNORMAL LOW (ref 1.2–2.2)
Albumin: 4.2 g/dL (ref 4.0–5.0)
Alkaline Phosphatase: 97 IU/L (ref 44–121)
BUN/Creatinine Ratio: 11 (ref 9–20)
BUN: 10 mg/dL (ref 6–24)
Bilirubin Total: 0.3 mg/dL (ref 0.0–1.2)
CO2: 28 mmol/L (ref 20–29)
Calcium: 9.4 mg/dL (ref 8.7–10.2)
Chloride: 96 mmol/L (ref 96–106)
Creatinine, Ser: 0.9 mg/dL (ref 0.76–1.27)
Globulin, Total: 4.5 g/dL (ref 1.5–4.5)
Glucose: 106 mg/dL — ABNORMAL HIGH (ref 70–99)
Potassium: 4.2 mmol/L (ref 3.5–5.2)
Sodium: 137 mmol/L (ref 134–144)
Total Protein: 8.7 g/dL — ABNORMAL HIGH (ref 6.0–8.5)
eGFR: 110 mL/min/{1.73_m2} (ref >59)

## 2022-02-25 LAB — CBC WITH DIFFERENTIAL/PLATELET
Basophils Absolute: 0 10*3/uL (ref 0.0–0.2)
Basos: 1 %
EOS (ABSOLUTE): 0.1 10*3/uL (ref 0.0–0.4)
Eos: 2 %
Hematocrit: 42 % (ref 37.5–51.0)
Hemoglobin: 13.9 g/dL (ref 13.0–17.7)
Immature Grans (Abs): 0.1 10*3/uL (ref 0.0–0.1)
Immature Granulocytes: 1 %
Lymphocytes Absolute: 1.7 10*3/uL (ref 0.7–3.1)
Lymphs: 32 %
MCH: 28.1 pg (ref 26.6–33.0)
MCHC: 33.1 g/dL (ref 31.5–35.7)
MCV: 85 fL (ref 79–97)
Monocytes Absolute: 0.4 10*3/uL (ref 0.1–0.9)
Monocytes: 8 %
Neutrophils Absolute: 3 10*3/uL (ref 1.4–7.0)
Neutrophils: 56 %
Platelets: 293 10*3/uL (ref 150–450)
RBC: 4.94 x10E6/uL (ref 4.14–5.80)
RDW: 12.8 % (ref 11.6–15.4)
WBC: 5.3 10*3/uL (ref 3.4–10.8)

## 2022-04-09 ENCOUNTER — Other Ambulatory Visit: Payer: Self-pay | Admitting: Family Medicine

## 2022-04-21 ENCOUNTER — Ambulatory Visit: Payer: BC Managed Care – PPO | Admitting: Family Medicine

## 2022-04-21 ENCOUNTER — Encounter: Payer: Self-pay | Admitting: Family Medicine

## 2022-04-21 VITALS — BP 125/79 | HR 92 | Temp 98.8°F | Ht 68.0 in | Wt 189.0 lb

## 2022-04-21 DIAGNOSIS — R051 Acute cough: Secondary | ICD-10-CM | POA: Diagnosis not present

## 2022-04-21 DIAGNOSIS — J069 Acute upper respiratory infection, unspecified: Secondary | ICD-10-CM | POA: Diagnosis not present

## 2022-04-21 MED ORDER — PSEUDOEPH-BROMPHEN-DM 30-2-10 MG/5ML PO SYRP: 5.0000 mL | ORAL_SOLUTION | Freq: Four times a day (QID) | ORAL | 0 refills | Status: DC | PRN

## 2022-04-21 NOTE — Progress Notes (Signed)
Subjective:  Patient ID: Mark Maddox, male    DOB: [birth year], 41 y.o.   MRN: [id number]  Patient Care Team: Janora Norlander, DO as PCP - General (Family Medicine)   Chief Complaint:  cough, congestion and URI   HPI: Mark Maddox is a 41 y.o. male presenting on 04/21/2022 for cough, congestion and URI   URI  This is a new problem. The current episode started yesterday. The problem has been unchanged. There has been no fever. Associated symptoms include congestion, coughing and rhinorrhea. Pertinent negatives include no abdominal pain, chest pain, diarrhea, dysuria, ear pain, headaches, joint pain, joint swelling, nausea, neck pain, plugged ear sensation, rash, sinus pain, sneezing, sore throat, swollen glands, vomiting or wheezing. He has tried decongestant and acetaminophen for the symptoms. The treatment provided mild relief.    Relevant past medical, surgical, family, and social history reviewed and updated as indicated.  Allergies and medications reviewed and updated. Data reviewed: Chart in Epic.   Past Medical History:  Diagnosis Date   Asthma    had asthma as a child   Enlarged prostate    Hypertension    hx of    Inguinal hernia bilateral, non-recurrent     Past Surgical History:  Procedure Laterality Date   dental extractions     wisdom teeth   HERNIA REPAIR     INGUINAL HERNIA REPAIR  07/04/2012   Procedure: LAPAROSCOPIC BILATERAL INGUINAL HERNIA REPAIR;  Surgeon: Ralene Ok, MD;  Location: [facility];  Service: General;  Laterality: Bilateral;   INSERTION OF MESH  07/04/2012   Procedure: INSERTION OF MESH;  Surgeon: Ralene Ok, MD;  Location: [facility];  Service: General;  Laterality: Bilateral;    Social History   Socioeconomic History   Marital status: Single    Spouse name: Not on file   Number of children: 0   Years of education: Not on file   Highest education level: Not on file  Occupational History   Occupation: truck driver  Tobacco  Use   Smoking status: Never   Smokeless tobacco: Never  Vaping Use   Vaping Use: Never used  Substance and Sexual Activity   Alcohol use: No   Drug use: No   Sexual activity: Not on file  Other Topics Concern   Not on file  Social History Narrative   Not on file   Social Determinants of Health   Financial Resource Strain: Not on file  Food Insecurity: Not on file  Transportation Needs: Not on file  Physical Activity: Not on file  Stress: Not on file  Social Connections: Not on file  Intimate Partner Violence: Not on file    Outpatient Encounter Medications as of 04/21/2022  Medication Sig   amLODipine (NORVASC) 10 MG tablet TAKE 1 TABLET BY MOUTH EVERY DAY   brompheniramine-pseudoephedrine-DM 30-2-10 MG/5ML syrup Take 5 mLs by mouth 4 (four) times daily as needed.   cyclobenzaprine (FLEXERIL) 10 MG tablet Take 1 tablet (10 mg total) by mouth 3 (three) times daily as needed for muscle spasms.   hydrochlorothiazide (HYDRODIURIL) 25 MG tablet Take 25 mg by mouth daily.   No facility-administered encounter medications on file as of 04/21/2022.    No Known Allergies  Review of Systems  Constitutional:  Positive for activity change. Negative for appetite change, chills, diaphoresis, fatigue, fever and unexpected weight change.  HENT:  Positive for congestion, postnasal drip and rhinorrhea. Negative for dental problem, drooling, ear discharge, ear pain, facial  swelling, hearing loss, mouth sores, nosebleeds, sinus pressure, sinus pain, sneezing, sore throat, tinnitus, trouble swallowing and voice change.   Eyes:  Negative for photophobia and visual disturbance.  Respiratory:  Positive for cough. Negative for apnea, choking, chest tightness, shortness of breath, wheezing and stridor.   Cardiovascular:  Negative for chest pain.  Gastrointestinal:  Negative for abdominal pain, diarrhea, nausea and vomiting.  Genitourinary:  Negative for decreased urine volume, difficulty urinating and  dysuria.  Musculoskeletal:  Negative for joint pain and neck pain.  Skin:  Negative for rash.  Neurological:  Negative for dizziness, weakness, light-headedness and headaches.  Psychiatric/Behavioral:  Negative for confusion.   All other systems reviewed and are negative.       Objective:  BP 125/79   Pulse 92   Temp 98.8 F (37.1 C)   Ht _0  (1.727 m)   Wt 189 lb (85.7 kg)   SpO2 95%   BMI 28.74 kg/m    Wt Readings from Last 3 Encounters:  04/21/22 189 lb (85.7 kg)  02/24/22 183 lb (83 kg)  09/27/21 198 lb 6.4 oz (90 kg)    Physical Exam Vitals and nursing note reviewed.  Constitutional:      General: He is not in acute distress.    Appearance: Normal appearance. He is well-developed, well-groomed and overweight. He is not ill-appearing, toxic-appearing or diaphoretic.  HENT:     Head: Normocephalic and atraumatic.     Jaw: There is normal jaw occlusion.     Right Ear: Hearing, tympanic membrane, ear canal and external ear normal.     Left Ear: Hearing, tympanic membrane, ear canal and external ear normal.     Nose: Congestion present.     Mouth/Throat:     Lips: Pink.     Mouth: Mucous membranes are moist.     Pharynx: Oropharynx is clear. Uvula midline. Posterior oropharyngeal erythema present. No pharyngeal swelling, oropharyngeal exudate or uvula swelling.     Tonsils: No tonsillar exudate or tonsillar abscesses.  Eyes:     General: Lids are normal.     Conjunctiva/sclera: Conjunctivae normal.     Pupils: Pupils are equal, round, and reactive to light.  Neck:     Thyroid: No thyroid mass, thyromegaly or thyroid tenderness.     Vascular: No carotid bruit or JVD.     Trachea: Trachea and phonation normal.  Cardiovascular:     Rate and Rhythm: Normal rate and regular rhythm.     Chest Wall: PMI is not displaced.     Pulses: Normal pulses.     Heart sounds: Normal heart sounds. No murmur heard.    No friction rub. No gallop.  Pulmonary:     Effort:  Pulmonary effort is normal. No respiratory distress.     Breath sounds: Normal breath sounds. No wheezing.  Abdominal:     General: There is no abdominal bruit.     Palpations: There is no hepatomegaly or splenomegaly.  Musculoskeletal:     Cervical back: Normal range of motion and neck supple.     Right lower leg: No edema.     Left lower leg: No edema.  Lymphadenopathy:     Cervical: No cervical adenopathy.  Skin:    General: Skin is warm and dry.     Capillary Refill: Capillary refill takes less than 2 seconds.     Coloration: Skin is not cyanotic, jaundiced or pale.     Findings: No rash.  Neurological:  General: No focal deficit present.     Mental Status: He is alert and oriented to person, place, and time.     Sensory: Sensation is intact.     Motor: Motor function is intact.     Coordination: Coordination is intact.     Gait: Gait is intact.     Deep Tendon Reflexes: Reflexes are normal and symmetric.  Psychiatric:        Attention and Perception: Attention and perception normal.        Mood and Affect: Mood and affect normal.        Speech: Speech normal.        Behavior: Behavior normal. Behavior is cooperative.        Thought Content: Thought content normal.        Cognition and Memory: Cognition and memory normal.        Judgment: Judgment normal.     Results for orders placed or performed in visit on 02/24/22  CBC with Differential/Platelet  Result Value Ref Range   WBC 5.3 3.4 - 10.8 x10E3/uL   RBC 4.94 4.14 - 5.80 x10E6/uL   Hemoglobin 13.9 13.0 - 17.7 g/dL   Hematocrit 42.0 37.5 - 51.0 %   MCV 85 79 - 97 fL   MCH 28.1 26.6 - 33.0 pg   MCHC 33.1 31.5 - 35.7 g/dL   RDW 12.8 11.6 - 15.4 %   Platelets 293 150 - 450 x10E3/uL   Neutrophils 56 Not Estab. %   Lymphs 32 Not Estab. %   Monocytes 8 Not Estab. %   Eos 2 Not Estab. %   Basos 1 Not Estab. %   Neutrophils Absolute 3.0 1.4 - 7.0 x10E3/uL   Lymphocytes Absolute 1.7 0.7 - 3.1 x10E3/uL    Monocytes Absolute 0.4 0.1 - 0.9 x10E3/uL   EOS (ABSOLUTE) 0.1 0.0 - 0.4 x10E3/uL   Basophils Absolute 0.0 0.0 - 0.2 x10E3/uL   Immature Granulocytes 1 Not Estab. %   Immature Grans (Abs) 0.1 0.0 - 0.1 x10E3/uL  CMP14+EGFR  Result Value Ref Range   Glucose 106 (H) 70 - 99 mg/dL   BUN 10 6 - 24 mg/dL   Creatinine, Ser 0.90 0.76 - 1.27 mg/dL   eGFR 110 >59 mL/min/1.73   BUN/Creatinine Ratio 11 9 - 20   Sodium 137 134 - 144 mmol/L   Potassium 4.2 3.5 - 5.2 mmol/L   Chloride 96 96 - 106 mmol/L   CO2 28 20 - 29 mmol/L   Calcium 9.4 8.7 - 10.2 mg/dL   Total Protein 8.7 (H) 6.0 - 8.5 g/dL   Albumin 4.2 4.0 - 5.0 g/dL   Globulin, Total 4.5 1.5 - 4.5 g/dL   Albumin/Globulin Ratio 0.9 (L) 1.2 - 2.2   Bilirubin Total 0.3 0.0 - 1.2 mg/dL   Alkaline Phosphatase 97 44 - 121 IU/L   AST 146 (H) 0 - 40 IU/L   ALT 122 (H) 0 - 44 IU/L       Pertinent labs & imaging results that were available during my care of the patient were reviewed by me and considered in my medical decision making.  Assessment & Plan:  Roman was seen today for cough, congestion and uri.  Diagnoses and all orders for this visit:  Acute cough -     COVID-19, Flu A+B and RSV  URI with cough and congestion No indications of acute bacterial infection. Viral testing pending. Symptomatic care discussed in detail. Bromfed as needed for symptomatic relief.  Increase fluids and rest. Tylenol or motrin as needed for fever and pain control. Report new, worsening, or persistent symptoms.  -     brompheniramine-pseudoephedrine-DM 30-2-10 MG/5ML syrup; Take 5 mLs by mouth 4 (four) times daily as needed.     Continue all other maintenance medications.  Follow up plan: Return if symptoms worsen or fail to improve.   Continue healthy lifestyle choices, including diet (rich in fruits, vegetables, and lean proteins, and low in salt and simple carbohydrates) and exercise (at least 30 minutes of moderate physical activity  daily).  Educational handout given for URI  The above assessment and management plan was discussed with the patient. The patient verbalized understanding of and has agreed to the management plan. Patient is aware to call the clinic if they develop any new symptoms or if symptoms persist or worsen. Patient is aware when to return to the clinic for a follow-up visit. Patient educated on when it is appropriate to go to the emergency department.   Monia Pouch, FNP-C [facility] [phone]

## 2022-04-22 LAB — COVID-19, FLU A+B AND RSV
Influenza A, NAA: NOT DETECTED
Influenza B, NAA: NOT DETECTED
RSV, NAA: NOT DETECTED
SARS-CoV-2, NAA: DETECTED — AB

## 2022-04-29 ENCOUNTER — Encounter: Payer: Self-pay | Admitting: Family

## 2022-04-29 ENCOUNTER — Telehealth: Payer: BC Managed Care – PPO | Admitting: Family

## 2022-04-29 DIAGNOSIS — H1033 Unspecified acute conjunctivitis, bilateral: Secondary | ICD-10-CM

## 2022-04-29 MED ORDER — POLYMYXIN B-TRIMETHOPRIM 10000-0.1 UNIT/ML-% OP SOLN: 1.0000 [drp] | Freq: Four times a day (QID) | OPHTHALMIC | 0 refills | Status: DC

## 2022-04-29 NOTE — Progress Notes (Signed)
Virtual Visit Consent   Mark Maddox, you are scheduled for a virtual visit with a [facility] provider today. Just as with appointments in the office, your consent must be obtained to participate. Your consent will be active for this visit and any virtual visit you may have with one of our providers in the next 365 days. If you have a MyChart account, a copy of this consent can be sent to you electronically.  As this is a virtual visit, video technology does not allow for your provider to perform a traditional examination. This may limit your provider's ability to fully assess your condition. If your provider identifies any concerns that need to be evaluated in person or the need to arrange testing (such as labs, EKG, etc.), we will make arrangements to do so. Although advances in technology are sophisticated, we cannot ensure that it will always work on either your end or our end. If the connection with a video visit is poor, the visit may have to be switched to a telephone visit. With either a video or telephone visit, we are not always able to ensure that we have a secure connection.  By engaging in this virtual visit, you consent to the provision of healthcare and authorize for your insurance to be billed (if applicable) for the services provided during this visit. Depending on your insurance coverage, you may receive a charge related to this service.  I need to obtain your verbal consent now. Are you willing to proceed with your visit today? Mark Maddox has provided verbal consent on 04/29/2022 for a virtual visit (video or telephone). Jannifer Rodney, FNP  Date: 04/29/2022 8:28 AM  Virtual Visit via Video Note   I, Jannifer Rodney, connected with  Mark Maddox  ([id number], 12-Nov-1980) on 04/29/22 at  8:25 AM EDT by a video-enabled telemedicine application and verified that I am speaking with the correct person using two identifiers.  Location: Patient: Virtual Visit Location Patient: Other:  work Provider: Pharmacist, community: Home Office   I discussed the limitations of evaluation and management by telemedicine and the availability of in person appointments. The patient expressed understanding and agreed to proceed.    History of Present Illness: Mark Maddox is a 41 y.o. who identifies as a male who was assigned male at birth, and is being seen today for erythemas of right eye and feels gritty that started yesterday. States his left eye is feeling slightly "gritty" and itching this morning.  HPI: Conjunctivitis  The current episode started yesterday. The onset was sudden. The problem occurs continuously. The problem has been gradually worsening. Associated symptoms include a fever, eye itching, congestion, eye discharge, eye pain and eye redness. Pertinent negatives include no decreased vision, no double vision, no photophobia, no ear discharge, no ear pain and no headaches. The eye pain is mild. Both eyes are affected.    Problems:  Patient Active Problem List   Diagnosis Date Noted   Overweight with body mass index (BMI) 25.0-29.9 11/24/2017   Essential hypertension 11/24/2017   NAFLD (nonalcoholic fatty liver disease) 58/04/9832   Irritable bowel syndrome 03/27/2017   BPH (benign prostatic hyperplasia) 03/27/2017   Elevated LFTs 01/25/2017    Allergies: No Known Allergies Medications:  Current Outpatient Medications:    trimethoprim-polymyxin b (POLYTRIM) ophthalmic solution, Place 1 drop into the left eye every 6 (six) hours., Disp: 10 mL, Rfl: 0   amLODipine (NORVASC) 10 MG tablet, TAKE 1 TABLET BY MOUTH EVERY  DAY, Disp: 90 tablet, Rfl: 3   hydrochlorothiazide (HYDRODIURIL) 25 MG tablet, Take 25 mg by mouth daily., Disp: , Rfl:   Observations/Objective: Patient is well-developed, well-nourished in no acute distress.  Resting comfortably  at home.  Head is normocephalic, atraumatic.  No labored breathing.  Speech is clear and coherent with logical  content.  Patient is alert and oriented at baseline.  Right erythemas   Assessment and Plan: 1. Acute bacterial conjunctivitis of both eyes - trimethoprim-polymyxin b (POLYTRIM) ophthalmic solution; Place 1 drop into the left eye every 6 (six) hours.  Dispense: 10 mL; Refill: 0  Good hand hygiene  Avoid rubbing eye Cool compress as needed Follow up if symptoms worsen or do not improve   Follow Up Instructions: I discussed the assessment and treatment plan with the patient. The patient was provided an opportunity to ask questions and all were answered. The patient agreed with the plan and demonstrated an understanding of the instructions.  A copy of instructions were sent to the patient via MyChart unless otherwise noted below.     The patient was advised to call back or seek an in-person evaluation if the symptoms worsen or if the condition fails to improve as anticipated.  Time:  I spent 7 minutes with the patient via telehealth technology discussing the above problems/concerns.    Jannifer Rodney, FNP

## 2022-10-05 ENCOUNTER — Other Ambulatory Visit: Payer: Self-pay | Admitting: Family Medicine

## 2022-10-05 ENCOUNTER — Encounter: Payer: Self-pay | Admitting: Family Medicine

## 2022-10-05 ENCOUNTER — Ambulatory Visit: Payer: BC Managed Care – PPO | Admitting: Family Medicine

## 2022-10-05 VITALS — BP 127/83 | HR 89 | Temp 98.6°F | Ht 68.0 in | Wt 193.0 lb

## 2022-10-05 DIAGNOSIS — Z6829 Body mass index (BMI) 29.0-29.9, adult: Secondary | ICD-10-CM

## 2022-10-05 DIAGNOSIS — E663 Overweight: Secondary | ICD-10-CM

## 2022-10-05 DIAGNOSIS — E119 Type 2 diabetes mellitus without complications: Secondary | ICD-10-CM

## 2022-10-05 DIAGNOSIS — E781 Pure hyperglyceridemia: Secondary | ICD-10-CM | POA: Insufficient documentation

## 2022-10-05 DIAGNOSIS — J301 Allergic rhinitis due to pollen: Secondary | ICD-10-CM

## 2022-10-05 DIAGNOSIS — R739 Hyperglycemia, unspecified: Secondary | ICD-10-CM | POA: Diagnosis not present

## 2022-10-05 DIAGNOSIS — I10 Essential (primary) hypertension: Secondary | ICD-10-CM

## 2022-10-05 DIAGNOSIS — K760 Fatty (change of) liver, not elsewhere classified: Secondary | ICD-10-CM

## 2022-10-05 DIAGNOSIS — Z713 Dietary counseling and surveillance: Secondary | ICD-10-CM

## 2022-10-05 LAB — BAYER DCA HB A1C WAIVED: HB A1C (BAYER DCA - WAIVED): 6.7 % — ABNORMAL HIGH (ref 4.8–5.6)

## 2022-10-05 MED ORDER — HYDROCHLOROTHIAZIDE 25 MG PO TABS: 25.0000 mg | ORAL_TABLET | Freq: Every day | ORAL | 3 refills | Status: DC

## 2022-10-05 MED ORDER — OZEMPIC (0.25 OR 0.5 MG/DOSE) 2 MG/3ML ~~LOC~~ SOPN
PEN_INJECTOR | SUBCUTANEOUS | 3 refills | Status: DC
  Filled 2022-12-01 (×2): qty 3, 28d supply, fill #0

## 2022-10-05 MED ORDER — AMLODIPINE BESYLATE 10 MG PO TABS: 10.0000 mg | ORAL_TABLET | Freq: Every day | ORAL | 3 refills | Status: DC

## 2022-10-05 MED ORDER — ZEPBOUND 5 MG/0.5ML ~~LOC~~ SOAJ: 5.0000 mg | SUBCUTANEOUS | 0 refills | Status: DC

## 2022-10-05 MED ORDER — ZEPBOUND 2.5 MG/0.5ML ~~LOC~~ SOAJ: 2.5000 mg | SUBCUTANEOUS | 0 refills | Status: DC

## 2022-10-05 MED ORDER — AZELASTINE HCL 0.1 % NA SOLN: 1.0000 | Freq: Two times a day (BID) | NASAL | 12 refills | Status: DC

## 2022-10-05 MED ORDER — ZEPBOUND 7.5 MG/0.5ML ~~LOC~~ SOAJ: 7.5000 mg | SUBCUTANEOUS | 0 refills | Status: DC

## 2022-10-05 NOTE — Patient Instructions (Signed)
Tips for success with Zepbound (and by success, how not to be super sick on your stomach): Eat small meals AVOID heavy foods (fried/ high in carbs like bread, pasta, rice) AVOID carbonated beverages (soda/ beer, as these can increase bloating) DOUBLE your water intake (will help you avoid constipation/ dehydration)  Zepbound CAN cause: Nausea Abdominal pain Increased acid reflux (sometimes presents as "sour burps") Constipation OR Diarrhea Fatigue (especially when you first start it)

## 2022-10-05 NOTE — Progress Notes (Signed)
Subjective: CC: Discussed weight management PCP: Janora Norlander, DO PH:[id number] Mark Maddox is a 42 y.o. male presenting to clinic today for:  1.  Nonalcoholic fatty liver disease associated with hypertriglyceridemia, elevated serum glucose and overweight BMI Patient here to discuss options for weight loss treatment.  He reports that he has been attending the bariatric clinic and getting a weight loss injection.  This seemed to have been working for him for a bit but he is currently stalled out.  He has tried lifestyle modification in the past with inability to maintain.  His goal weight is about 150 pounds.  He has been on phentermine in the past, has used over-the-counter weight loss supplements, has done intermittent fasting and keto diet but none of these have been maintainable.  He is interested in possibly starting a GLP.  Denies any personal or family history of medullary thyroid cancer, multiple endocrine type II neoplasia or pancreatitis.   ROS: Per HPI  No Known Allergies Past Medical History:  Diagnosis Date   Asthma    had asthma as a child   Enlarged prostate    Hypertension    hx of    Inguinal hernia bilateral, non-recurrent     Current Outpatient Medications:    azelastine (ASTELIN) 0.1 % nasal spray, Place 1 spray into both nostrils 2 (two) times daily., Disp: 30 mL, Rfl: 12   amLODipine (NORVASC) 10 MG tablet, Take 1 tablet (10 mg total) by mouth daily., Disp: 90 tablet, Rfl: 3   hydrochlorothiazide (HYDRODIURIL) 25 MG tablet, Take 1 tablet (25 mg total) by mouth daily., Disp: 90 tablet, Rfl: 3 Social History   Socioeconomic History   Marital status: Single    Spouse name: Not on file   Number of children: 0   Years of education: Not on file   Highest education level: Not on file  Occupational History   Occupation: truck driver  Tobacco Use   Smoking status: Never   Smokeless tobacco: Never  Vaping Use   Vaping Use: Never used  Substance and Sexual  Activity   Alcohol use: No   Drug use: No   Sexual activity: Not on file  Other Topics Concern   Not on file  Social History Narrative   Not on file   Social Determinants of Health   Financial Resource Strain: Not on file  Food Insecurity: Not on file  Transportation Needs: Not on file  Physical Activity: Not on file  Stress: Not on file  Social Connections: Not on file  Intimate Partner Violence: Not on file   Family History  Problem Relation Age of Onset   Diabetes Maternal Grandmother    Heart disease Maternal Grandfather        heart attack   Diabetes Maternal Grandfather    Prostate cancer Paternal Grandfather     Objective: Office vital signs reviewed. BP 127/83   Pulse 89   Temp 98.6 F (37 C)   Ht 5' 8"$  (1.727 m)   Wt 193 lb (87.5 kg)   SpO2 95%   BMI 29.35 kg/m   Physical Examination:  General: Awake, alert, well appearing, overweight male, No acute distress HEENT: Normal    Eyes: extraocular membranes intact, sclera white    Nose: nasal turbinates moist, no nasal discharge    Throat: moist mucus membranes  Cardio: regular rate and rhythm, S1S2 heard, no murmurs appreciated Pulm: clear to auscultation bilaterally, no wheezes, rhonchi or rales; normal work of breathing on room  air    Assessment/ Plan: 42 y.o. male   Weight loss counseling, encounter for  Overweight with body mass index (BMI) 25.0-29.9 - Plan: CMP14+EGFR, Semaglutide,0.25 or 0.5MG/DOS, (OZEMPIC, 0.25 OR 0.5 MG/DOSE,) 2 MG/3ML SOPN, DISCONTINUED: tirzepatide (ZEPBOUND) 2.5 MG/0.5ML Pen, DISCONTINUED: tirzepatide (ZEPBOUND) 5 MG/0.5ML Pen, DISCONTINUED: tirzepatide (ZEPBOUND) 7.5 MG/0.5ML Pen  Essential hypertension - Plan: amLODipine (NORVASC) 10 MG tablet, hydrochlorothiazide (HYDRODIURIL) 25 MG tablet, CMP14+EGFR, Semaglutide,0.25 or 0.5MG/DOS, (OZEMPIC, 0.25 OR 0.5 MG/DOSE,) 2 MG/3ML SOPN, DISCONTINUED: tirzepatide (ZEPBOUND) 2.5 MG/0.5ML Pen, DISCONTINUED: tirzepatide (ZEPBOUND) 5  MG/0.5ML Pen, DISCONTINUED: tirzepatide (ZEPBOUND) 7.5 MG/0.5ML Pen  Elevated serum glucose - Plan: CMP14+EGFR, Bayer DCA Hb A1c Waived, DISCONTINUED: tirzepatide (ZEPBOUND) 2.5 MG/0.5ML Pen, DISCONTINUED: tirzepatide (ZEPBOUND) 5 MG/0.5ML Pen, DISCONTINUED: tirzepatide (ZEPBOUND) 7.5 MG/0.5ML Pen  NAFLD (nonalcoholic fatty liver disease) - Plan: CMP14+EGFR, Semaglutide,0.25 or 0.5MG/DOS, (OZEMPIC, 0.25 OR 0.5 MG/DOSE,) 2 MG/3ML SOPN, DISCONTINUED: tirzepatide (ZEPBOUND) 2.5 MG/0.5ML Pen, DISCONTINUED: tirzepatide (ZEPBOUND) 5 MG/0.5ML Pen, DISCONTINUED: tirzepatide (ZEPBOUND) 7.5 MG/0.5ML Pen  Hypertriglyceridemia - Plan: CMP14+EGFR, Semaglutide,0.25 or 0.5MG/DOS, (OZEMPIC, 0.25 OR 0.5 MG/DOSE,) 2 MG/3ML SOPN, DISCONTINUED: tirzepatide (ZEPBOUND) 2.5 MG/0.5ML Pen, DISCONTINUED: tirzepatide (ZEPBOUND) 5 MG/0.5ML Pen, DISCONTINUED: tirzepatide (ZEPBOUND) 7.5 MG/0.5ML Pen  Seasonal allergic rhinitis due to pollen - Plan: azelastine (ASTELIN) 0.1 % nasal spray, CMP14+EGFR  New onset type 2 diabetes mellitus (HCC) - Plan: Semaglutide,0.25 or 0.5MG/DOS, (OZEMPIC, 0.25 OR 0.5 MG/DOSE,) 2 MG/3ML SOPN  Liver function test and spot A1c was collected today given his overweight BMI, hypertension, hyperlipidemia and known NASH.  Unfortunately, we found an A1c of 6.7 indicating that he has new onset type 2 diabetes.  For this reason I elected to discontinue order for stepdown and start him on Ozempic given the cardiovascular benefits and hopefully we will see that this improves his fatty liver disease.  We discussed the risks and benefits of this class of medication.  We discussed red flag signs and symptoms and expectations and how to reduce unwanted side effects of the GLP.  I would like to see him back in the next 2 to 3 months for A1c, interval checkup etc.  He will follow-up with me sooner if concerns arise.  Plan for nutrition counseling during, foot exam, diabetic eye exam and urine microalbumin at the next  visit.  Blood pressure is controlled.  Medication sent.  Also plan for fasting labs and likely need for statin initiation pending liver function tests  I have given him a prescription for Astelin as this has been helpful for his seasonal allergic rhinitis in the past.   No orders of the defined types were placed in this encounter.  Meds ordered this encounter  Medications   amLODipine (NORVASC) 10 MG tablet    Sig: Take 1 tablet (10 mg total) by mouth daily.    Dispense:  90 tablet    Refill:  3   hydrochlorothiazide (HYDRODIURIL) 25 MG tablet    Sig: Take 1 tablet (25 mg total) by mouth daily.    Dispense:  90 tablet    Refill:  3   azelastine (ASTELIN) 0.1 % nasal spray    Sig: Place 1 spray into both nostrils 2 (two) times daily.    Dispense:  30 mL    Refill:  [place], [place] [phone]

## 2022-10-06 LAB — CMP14+EGFR
ALT: 57 IU/L — ABNORMAL HIGH (ref 0–44)
AST: 70 IU/L — ABNORMAL HIGH (ref 0–40)
Albumin/Globulin Ratio: 0.8 — ABNORMAL LOW (ref 1.2–2.2)
Albumin: 4.2 g/dL (ref 4.1–5.1)
Alkaline Phosphatase: 87 IU/L (ref 44–121)
BUN/Creatinine Ratio: 14 (ref 9–20)
BUN: 12 mg/dL (ref 6–24)
Bilirubin Total: 0.4 mg/dL (ref 0.0–1.2)
CO2: 25 mmol/L (ref 20–29)
Calcium: 9.6 mg/dL (ref 8.7–10.2)
Chloride: 100 mmol/L (ref 96–106)
Creatinine, Ser: 0.87 mg/dL (ref 0.76–1.27)
Globulin, Total: 5 g/dL — ABNORMAL HIGH (ref 1.5–4.5)
Glucose: 105 mg/dL — ABNORMAL HIGH (ref 70–99)
Potassium: 4.3 mmol/L (ref 3.5–5.2)
Sodium: 139 mmol/L (ref 134–144)
Total Protein: 9.2 g/dL — ABNORMAL HIGH (ref 6.0–8.5)
eGFR: 110 mL/min/{1.73_m2} (ref >59)

## 2022-10-10 ENCOUNTER — Other Ambulatory Visit (HOSPITAL_COMMUNITY): Payer: Self-pay

## 2022-10-10 NOTE — Telephone Encounter (Signed)
Pharmacy Patient Advocate Encounter  Prior Authorization for [facility] has been approved.    PA# [id number] MM Effective dates: 10/10/2022 through 05/11/2023

## 2022-10-10 NOTE — Telephone Encounter (Signed)
Patient Advocate Encounter   Received notification from [facility] that prior authorization for [facility] is required.   PA submitted on 10/10/2022 Key [id number] Status is pending

## 2022-10-20 ENCOUNTER — Telehealth: Payer: Self-pay | Admitting: Family Medicine

## 2022-10-20 NOTE — Telephone Encounter (Signed)
TC to pt, Wegovy not on his med list, he received call from [facility] that it was approved Please advise

## 2022-10-21 ENCOUNTER — Telehealth: Payer: Self-pay

## 2022-10-21 NOTE — Telephone Encounter (Signed)
Attempted to call pt no answer  

## 2022-10-21 NOTE — Telephone Encounter (Signed)
He doesn't need Wegovy.  We put him on Ozempic because he showed DIABETES at that visit.  The chemical is the same but indication Totally different.  Did he start Ozempic??

## 2022-10-27 NOTE — Telephone Encounter (Signed)
Pt has been notified.

## 2022-11-29 ENCOUNTER — Other Ambulatory Visit (HOSPITAL_COMMUNITY): Payer: Self-pay

## 2022-11-29 ENCOUNTER — Telehealth: Payer: Self-pay

## 2022-11-29 NOTE — Telephone Encounter (Signed)
[facility] Patient Product/process development scientist completed.    The patient is insured through [employer] and has a $100.00 copay.  Patient will need a copay card.  We will continue to follow to see if copay assistance is needed.  Clearance Mark Maddox, CPhT Specialty Pharmacy Patient [facility] Phone: [phone] Fax:  [phone]

## 2022-11-30 ENCOUNTER — Ambulatory Visit: Payer: BC Managed Care – PPO | Admitting: Pharmacist

## 2022-11-30 ENCOUNTER — Other Ambulatory Visit (HOSPITAL_COMMUNITY)
Admission: RE | Admit: 2022-11-30 | Discharge: 2022-11-30 | Disposition: A | Discharge status: Home / Self Care | Payer: BC Managed Care – PPO | Source: Ambulatory Visit | Attending: Internal Medicine | Admitting: Internal Medicine

## 2022-11-30 ENCOUNTER — Telehealth: Payer: Self-pay

## 2022-11-30 ENCOUNTER — Other Ambulatory Visit: Payer: Self-pay

## 2022-11-30 ENCOUNTER — Ambulatory Visit: Payer: BC Managed Care – PPO

## 2022-11-30 ENCOUNTER — Encounter: Payer: Self-pay | Admitting: Internal Medicine

## 2022-11-30 ENCOUNTER — Other Ambulatory Visit (HOSPITAL_COMMUNITY): Payer: Self-pay

## 2022-11-30 ENCOUNTER — Ambulatory Visit (INDEPENDENT_AMBULATORY_CARE_PROVIDER_SITE_OTHER): Payer: BC Managed Care – PPO

## 2022-11-30 ENCOUNTER — Ambulatory Visit: Payer: BC Managed Care – PPO | Admitting: Internal Medicine

## 2022-11-30 VITALS — BP 140/92 | HR 92 | Resp 16 | Ht 68.0 in | Wt 193.0 lb

## 2022-11-30 DIAGNOSIS — Z79899 Other long term (current) drug therapy: Secondary | ICD-10-CM | POA: Diagnosis not present

## 2022-11-30 DIAGNOSIS — B20 Human immunodeficiency virus [HIV] disease: Secondary | ICD-10-CM | POA: Insufficient documentation

## 2022-11-30 DIAGNOSIS — Z23 Encounter for immunization: Secondary | ICD-10-CM | POA: Diagnosis not present

## 2022-11-30 MED ORDER — BICTEGRAVIR-EMTRICITAB-TENOFOV 50-200-25 MG PO TABS: 1.0000 | ORAL_TABLET | Freq: Every day | ORAL | 5 refills | Status: DC

## 2022-11-30 MED ORDER — BICTEGRAVIR-EMTRICITAB-TENOFOV 50-200-25 MG PO TABS
1.0000 | ORAL_TABLET | Freq: Every day | ORAL | 5 refills | Status: DC
Start: 2022-11-30 — End: 2022-12-14
  Filled 2022-11-30 (×2): qty 30, 30d supply, fill #0

## 2022-11-30 NOTE — Progress Notes (Signed)
11/30/2022  HPI: Mark Maddox is a 42 y.o. male who presents to the [facility] clinic today to initiate care for a newly diagnosed HIV infection.  Patient Active Problem List   Diagnosis Date Noted   Hypertriglyceridemia 10/05/2022   Seasonal allergic rhinitis due to pollen 10/05/2022   Elevated serum glucose 10/05/2022   Overweight with body mass index (BMI) 25.0-29.9 11/24/2017   Essential hypertension 11/24/2017   NAFLD (nonalcoholic fatty liver disease) 56/38/7564   Irritable bowel syndrome 03/27/2017   BPH (benign prostatic hyperplasia) 03/27/2017   Elevated LFTs 01/25/2017    Patient's Medications  New Prescriptions   No medications on file  Previous Medications   AMLODIPINE (NORVASC) 10 MG TABLET    Take 1 tablet (10 mg total) by mouth daily.   AZELASTINE (ASTELIN) 0.1 % NASAL SPRAY    Place 1 spray into both nostrils 2 (two) times daily.   HYDROCHLOROTHIAZIDE (HYDRODIURIL) 25 MG TABLET    Take 1 tablet (25 mg total) by mouth daily.   SEMAGLUTIDE,0.25 OR 0.5MG /DOS, (OZEMPIC, 0.25 OR 0.5 MG/DOSE,) 2 MG/3ML SOPN    Inject 0.25 mg into the skin every 7 (seven) days for 28 days, THEN 0.5 mg every 7 (seven) days.  Modified Medications   No medications on file  Discontinued Medications   No medications on file    Allergies: No Known Allergies  Past Medical History: Past Medical History:  Diagnosis Date   Asthma    had asthma as a child   Enlarged prostate    Hypertension    hx of    Inguinal hernia bilateral, non-recurrent     Social History: Social History   Socioeconomic History   Marital status: Single    Spouse name: Not on file   Number of children: 0   Years of education: Not on file   Highest education level: Not on file  Occupational History   Occupation: truck driver  Tobacco Use   Smoking status: Never   Smokeless tobacco: Never  Vaping Use   Vaping Use: Never used  Substance and Sexual Activity   Alcohol use: No   Drug use: No   Sexual  activity: Not on file  Other Topics Concern   Not on file  Social History Narrative   Not on file   Social Determinants of Health   Financial Resource Strain: Not on file  Food Insecurity: Not on file  Transportation Needs: Not on file  Physical Activity: Not on file  Stress: Not on file  Social Connections: Not on file    Labs: No results found for: "HIV1RNAQUANT", "HIV1RNAVL", "CD4TABS"  RPR and STI No results found for: "LABRPR", "RPRTITER"      No data to display          Hepatitis B Lab Results  Component Value Date   HEPBSAB NEG 01/20/2017   HEPBSAG NEGATIVE 01/20/2017   Hepatitis C No results found for: "HEPCAB", "HCVRNAPCRQN" Hepatitis A Lab Results  Component Value Date   HAV NON REACTIVE 01/20/2017   Lipids: Lab Results  Component Value Date   CHOL 146 03/03/2021   TRIG 142 03/03/2021   HDL 29 (L) 03/03/2021   CHOLHDL 5.0 03/03/2021   LDLCALC 92 03/03/2021    Current HIV Regimen: Treatment naive  Assessment: Mark Maddox is here today to initiate care with Dr. Thedore Mins for his newly diagnosed HIV infection.  He is treatment naive. Will start patient on Biktarvy.  Provided counseling to patient including how to take the medication,  the importance of daily dosing, and to avoid over the counter supplements with metals in them and gave examples. He opted to participate in mail order pharmacy through [facility]. His new Biktarvy and prior Ozempic will both be filled through [facility]. Address confirmed. He was engaged and asked questions, all questions answered.   Plan: - Start [employer]  - Call with any questions or concerns   Blane Ohara, PharmD  PGY1 Pharmacy Resident

## 2022-11-30 NOTE — Telephone Encounter (Signed)
[facility] Patient Advocate Encounter   Was successful in obtaining a [employer] copay card for Biktarvy.  This copay card will make the patients copay $0.00.  I have spoken with the patient.    The billing information is as follows and has been shared with [facility].         Jhoselin Crume, CPhT Specialty Pharmacy Patient [facility] Phone: [phone] Fax:  [phone]  

## 2022-11-30 NOTE — Progress Notes (Signed)
[facility]     HPI: Mark Maddox is a 42 y.o. male presents for HIV management. Newly Cx at helath department, HIV 1+, on 10/16/22. 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 Pt works at [employer]. Pt has not been sexually active since July, 2023. Ex was drug addict. Pt has been tested for HIV in 2012 in negative PMHx: DM now ozempic Date of diagnosis: 2/25/'24 ART exposure: no Past Ois: no Risk factors: MSM,  Partners in last 61months 0, in the last 12 months 1.  Anal sex receptive y, insertivey . Contraception sometimes Oral sex, contraception no Vaginal penile sex, contraception no  Social: non drinker/does not smoke ciggerettes Occupation: Occupational hygienist Housing: leaves in house/mom Support: No one knows of his diagnosis, plans on telling family Understanding of HIV: fair   Past Medical History:  Diagnosis Date   Asthma    had asthma as a child   Enlarged prostate    Hypertension    hx of    Inguinal hernia bilateral, non-recurrent     Past Surgical History:  Procedure Laterality Date   dental extractions     wisdom teeth   HERNIA REPAIR     INGUINAL HERNIA REPAIR  07/04/2012   Procedure: LAPAROSCOPIC BILATERAL INGUINAL HERNIA REPAIR;  Surgeon: Axel Filler, MD;  Location: [facility] OR;  Service: General;  Laterality: Bilateral;   INSERTION OF MESH  07/04/2012   Procedure: INSERTION OF MESH;  Surgeon: Axel Filler, MD;  Location: [facility] OR;  Service: General;  Laterality: Bilateral;    Family History  Problem Relation Age of Onset   Diabetes Maternal Grandmother    Heart disease Maternal Grandfather        heart attack   Diabetes Maternal Grandfather    Prostate cancer Paternal Grandfather    Current Outpatient Medications on File Prior to Visit  Medication Sig Dispense Refill   amLODipine (NORVASC) 10 MG tablet Take 1 tablet (10 mg total) by mouth daily. 90 tablet 3   azelastine  (ASTELIN) 0.1 % nasal spray Place 1 spray into both nostrils 2 (two) times daily. 30 mL 12   hydrochlorothiazide (HYDRODIURIL) 25 MG tablet Take 1 tablet (25 mg total) by mouth daily. 90 tablet 3   Semaglutide,0.25 or 0.5MG /DOS, (OZEMPIC, 0.25 OR 0.5 MG/DOSE,) 2 MG/3ML SOPN Inject 0.25 mg into the skin every 7 (seven) days for 28 days, THEN 0.5 mg every 7 (seven) days. 9 mL 3   No current facility-administered medications on file prior to visit.    No Known Allergies    Lab Results No results found for: "HIV1RNAQUANT", "HIV1RNAVL", "CD4TABS" No results found for: "HIV1GENOSEQ" Lab Results  Component Value Date   WBC 5.3 02/24/2022   HGB 13.9 02/24/2022   HCT 42.0 02/24/2022   MCV 85 02/24/2022   PLT 293 02/24/2022    Lab Results  Component Value Date   CREATININE 0.87 10/05/2022   BUN 12 10/05/2022   NA 139 10/05/2022   K 4.3 10/05/2022   CL 100 10/05/2022   CO2 25 10/05/2022   Lab Results  Component Value Date   ALT 57 (H) 10/05/2022   AST 70 (H) 10/05/2022   ALKPHOS 87 10/05/2022   BILITOT 0.4 10/05/2022    Lab Results  Component Value Date   CHOL 146 03/03/2021   TRIG 142 03/03/2021   HDL 29 (L) 03/03/2021   LDLCALC 92 03/03/2021  Lab Results  Component Value Date   HAV NON REACTIVE 01/20/2017   Lab Results  Component Value Date   HEPBSAG NEGATIVE 01/20/2017   HEPBSAB NEG 01/20/2017   Lab Results  Component Value Date   HCVAB NEGATIVE 01/20/2017   No results found for: "CHLAMYDIAWP", "N" No results found for: "GCPROBEAPT" No results found for: "QUANTGOLD"  Assessment/Plan #HIV-newly diagnosed -CD4 pending, VLpending, on 11/30/22 -Start Biktarvy->interested in injectables -Pt has a dentist -HIV labs today Plan -Follow up in 2 weeks for labs  #Vaccination COVID x2, booster 11/30/22 Flu 11/30/22 Monkeypox not UTD PCV not UTD Meningitis not UTD HepA serology pending HEpB serology pending Tdap 11/24/2017 Shingles   #Dm-A1c 6.7 -Management  per primary  #Health maintenance -Quantiferon 11/30/22 -RPR4/10/24 -HCV 11/30/22 -GC urine/oral  11/30/22 -Lipid 11/30/22 -Dysplasia screen  M 11/30/22 -Colonoscopy 5 years ago for mucus per rectum, pt reports found internal hemmhroids    Danelle Earthly, MD [facility]  I have personally spent 90 minutes involved in face-to-face and non-face-to-face activities for this patient on the day of the visit. Professional time spent includes the following activities: Preparing to see the patient (review of tests), Obtaining and/or reviewing separately obtained history (admission/discharge record), Performing a medically appropriate examination and/or evaluation , Ordering medications/tests/procedures, referring and communicating with other health care professionals, Documenting clinical information in the EMR, Independently interpreting results (not separately reported), Communicating results to the patient/family/caregiver, Counseling and educating the patient/family/caregiver and Care coordination (not separately reported).

## 2022-12-01 ENCOUNTER — Other Ambulatory Visit (HOSPITAL_COMMUNITY): Payer: Self-pay

## 2022-12-01 ENCOUNTER — Other Ambulatory Visit: Payer: Self-pay

## 2022-12-01 LAB — URINE CYTOLOGY ANCILLARY ONLY
Chlamydia: NEGATIVE
Comment: NEGATIVE
Comment: NORMAL
Neisseria Gonorrhea: NEGATIVE

## 2022-12-01 LAB — CYTOLOGY, (ORAL, ANAL, URETHRAL) ANCILLARY ONLY
Chlamydia: NEGATIVE
Chlamydia: NEGATIVE
Comment: NEGATIVE
Comment: NEGATIVE
Comment: NORMAL
Comment: NORMAL
Neisseria Gonorrhea: NEGATIVE
Neisseria Gonorrhea: NEGATIVE

## 2022-12-01 LAB — T-HELPER CELL (CD4) - (RCID CLINIC ONLY)
CD4 % Helper T Cell: 7 % — ABNORMAL LOW (ref 33–65)
CD4 T Cell Abs: 64 /uL — ABNORMAL LOW (ref 400–1790)

## 2022-12-01 LAB — LIPID PANEL
Cholesterol: 106 mg/dL (ref <200)
Total CHOL/HDL Ratio: 3.7 (calc) (ref <5.0)

## 2022-12-02 LAB — QUANTIFERON-TB GOLD PLUS
QuantiFERON-TB Gold Plus: NEGATIVE
TB1-NIL: 0 IU/mL
TB2-NIL: 0 IU/mL

## 2022-12-05 LAB — HLA B*5701: HLA-B*5701 w/rflx HLA-B High: NEGATIVE

## 2022-12-06 LAB — CYTOLOGY - PAP
Comment: NEGATIVE
Diagnosis: UNDETERMINED — AB
High risk HPV: POSITIVE — AB

## 2022-12-13 LAB — COMPLETE METABOLIC PANEL WITH GFR
AG Ratio: 0.7 (calc) — ABNORMAL LOW (ref 1.0–2.5)
ALT: 48 U/L — ABNORMAL HIGH (ref 9–46)
AST: 52 U/L — ABNORMAL HIGH (ref 10–40)
Albumin: 4 g/dL (ref 3.6–5.1)
Alkaline phosphatase (APISO): 66 U/L (ref 36–130)
BUN: 11 mg/dL (ref 7–25)
CO2: 29 mmol/L (ref 20–32)
Calcium: 9.4 mg/dL (ref 8.6–10.3)
Chloride: 103 mmol/L (ref 98–110)
Creat: 0.82 mg/dL (ref 0.60–1.29)
Globulin: 5.5 g/dL (calc) — ABNORMAL HIGH (ref 1.9–3.7)
Glucose, Bld: 76 mg/dL (ref 65–99)
Potassium: 3.6 mmol/L (ref 3.5–5.3)
Sodium: 141 mmol/L (ref 135–146)
Total Bilirubin: 0.4 mg/dL (ref 0.2–1.2)
Total Protein: 9.5 g/dL — ABNORMAL HIGH (ref 6.1–8.1)
eGFR: 112 mL/min/{1.73_m2} (ref >=60)

## 2022-12-13 LAB — CBC WITH DIFFERENTIAL/PLATELET
Absolute Monocytes: 323 cells/uL (ref 200–950)
Basophils Absolute: 19 cells/uL (ref 0–200)
Basophils Relative: 0.6 %
Eosinophils Absolute: 61 cells/uL (ref 15–500)
Eosinophils Relative: 1.9 %
HCT: 40.7 % (ref 38.5–50.0)
Hemoglobin: 13.7 g/dL (ref 13.2–17.1)
Lymphs Abs: 1146 cells/uL (ref 850–3900)
MCH: 28.7 pg (ref 27.0–33.0)
MCHC: 33.7 g/dL (ref 32.0–36.0)
MCV: 85.1 fL (ref 80.0–100.0)
MPV: 9.3 fL (ref 7.5–12.5)
Monocytes Relative: 10.1 %
Neutro Abs: 1651 cells/uL (ref 1500–7800)
Neutrophils Relative %: 51.6 %
Platelets: 203 10*3/uL (ref 140–400)
RBC: 4.78 10*6/uL (ref 4.20–5.80)
RDW: 12.5 % (ref 11.0–15.0)
Total Lymphocyte: 35.8 %
WBC: 3.2 10*3/uL — ABNORMAL LOW (ref 3.8–10.8)

## 2022-12-13 LAB — LIPID PANEL
HDL: 29 mg/dL — ABNORMAL LOW (ref >=40)
LDL Cholesterol (Calc): 58 mg/dL (calc)
Non-HDL Cholesterol (Calc): 77 mg/dL (calc) (ref <130)
Triglycerides: 104 mg/dL (ref <150)

## 2022-12-13 LAB — HEPATITIS A ANTIBODY, TOTAL: Hepatitis A AB,Total: REACTIVE — AB

## 2022-12-13 LAB — HEPATITIS B CORE ANTIBODY, TOTAL: Hep B Core Total Ab: NONREACTIVE

## 2022-12-13 LAB — HIV-1 RNA ULTRAQUANT REFLEX TO GENTYP+
HIV 1 RNA Quant: 485000 copies/mL — ABNORMAL HIGH
HIV-1 RNA Quant, Log: 5.69 Log copies/mL — ABNORMAL HIGH

## 2022-12-13 LAB — QUANTIFERON-TB GOLD PLUS
Mitogen-NIL: 8.37 IU/mL
NIL: 0.05 IU/mL

## 2022-12-13 LAB — HIV-1 GENOTYPE: HIV-1 Genotype: DETECTED — AB

## 2022-12-13 LAB — HIV ANTIBODY (ROUTINE TESTING W REFLEX): HIV 1&2 Ab, 4th Generation: REACTIVE — AB

## 2022-12-13 LAB — HEPATITIS B SURFACE ANTIGEN: Hepatitis B Surface Ag: NONREACTIVE

## 2022-12-13 LAB — HIV-1/2 AB - DIFFERENTIATION
HIV-1 antibody: POSITIVE — AB
HIV-2 Ab: NEGATIVE

## 2022-12-13 LAB — RPR: RPR Ser Ql: NONREACTIVE

## 2022-12-13 LAB — HEPATITIS B SURFACE ANTIBODY,QUALITATIVE: Hep B S Ab: NONREACTIVE

## 2022-12-13 LAB — HEPATITIS C ANTIBODY: Hepatitis C Ab: NONREACTIVE

## 2022-12-14 ENCOUNTER — Encounter: Payer: Self-pay | Admitting: Internal Medicine

## 2022-12-14 ENCOUNTER — Other Ambulatory Visit: Payer: Self-pay

## 2022-12-14 ENCOUNTER — Ambulatory Visit: Payer: BC Managed Care – PPO | Admitting: Internal Medicine

## 2022-12-14 VITALS — BP 144/96 | HR 87 | Temp 98.9°F | Ht 68.0 in | Wt 189.0 lb

## 2022-12-14 DIAGNOSIS — B20 Human immunodeficiency virus [HIV] disease: Secondary | ICD-10-CM | POA: Diagnosis not present

## 2022-12-14 DIAGNOSIS — Z23 Encounter for immunization: Secondary | ICD-10-CM

## 2022-12-14 LAB — CBC WITH DIFFERENTIAL/PLATELET
Absolute Monocytes: 321 cells/uL (ref 200–950)
Eosinophils Absolute: 92 cells/uL (ref 15–500)
Lymphs Abs: 1302 cells/uL (ref 850–3900)
MCHC: 33.2 g/dL (ref 32.0–36.0)
MPV: 9.2 fL (ref 7.5–12.5)
Neutro Abs: 2653 cells/uL (ref 1500–7800)
Platelets: 234 10*3/uL (ref 140–400)
RDW: 13.1 % (ref 11.0–15.0)
WBC: 4.4 10*3/uL (ref 3.8–10.8)

## 2022-12-14 MED ORDER — SULFAMETHOXAZOLE-TRIMETHOPRIM 800-160 MG PO TABS: 1.0000 | ORAL_TABLET | Freq: Every day | ORAL | 5 refills | Status: DC

## 2022-12-14 MED ORDER — BICTEGRAVIR-EMTRICITAB-TENOFOV 50-200-25 MG PO TABS
1.0000 | ORAL_TABLET | Freq: Every day | ORAL | 5 refills | Status: DC
Start: 2022-12-14 — End: 2022-12-21

## 2022-12-14 NOTE — Progress Notes (Signed)
[facility]     HPI: Mark Maddox is a 42 y.o. male presents for HIV management. Newly Cx at helath department, HIV 1+, on 10/16/22. 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 Pt works at [employer]. Pt has not been sexually active since July, 2023. Ex was drug addict. Pt has been tested for HIV in 2012 in negative  Today: Notes hot and cold spell. Stomach aches lately that comes goes, thinks its improving.some nausea PMHx: DM now ozempic Date of diagnosis: 2/25/'24 ART exposure: no Past Ois: no Risk factors: MSM,  Partners in last 2months 0, in the last 12 months 1.  Anal sex receptive y, insertivey . Contraception sometimes Oral sex, contraception no Vaginal penile sex, contraception no   Social: non drinker/does not smoke ciggerettes Occupation: Occupational hygienist Housing: leaves in house/mom Support: No one knows of his diagnosis, plans on telling family Understanding of HIV: fair Etoh/drug/tobacco use:  Past Medical History:  Diagnosis Date   Asthma    had asthma as a child   Diabetes mellitus without complication    Enlarged prostate    Hypertension    hx of    Inguinal hernia bilateral, non-recurrent     Past Surgical History:  Procedure Laterality Date   dental extractions     wisdom teeth   HERNIA REPAIR     INGUINAL HERNIA REPAIR  07/04/2012   Procedure: LAPAROSCOPIC BILATERAL INGUINAL HERNIA REPAIR;  Surgeon: Axel Filler, MD;  Location: [facility] OR;  Service: General;  Laterality: Bilateral;   INSERTION OF MESH  07/04/2012   Procedure: INSERTION OF MESH;  Surgeon: Axel Filler, MD;  Location: [facility] OR;  Service: General;  Laterality: Bilateral;    Family History  Problem Relation Age of Onset   Diabetes Maternal Grandmother    Heart disease Maternal Grandfather        heart attack   Diabetes Maternal Grandfather    Prostate cancer Paternal Grandfather    Current Outpatient  Medications on File Prior to Visit  Medication Sig Dispense Refill   amLODipine (NORVASC) 10 MG tablet Take 1 tablet (10 mg total) by mouth daily. (Patient not taking: Reported on 11/30/2022) 90 tablet 3   azelastine (ASTELIN) 0.1 % nasal spray Place 1 spray into both nostrils 2 (two) times daily. 30 mL 12   bictegravir-emtricitabine-tenofovir AF (BIKTARVY) 50-200-25 MG TABS tablet Take 1 tablet by mouth daily. Try to take at the same time each day with or without food. 30 tablet 5   hydrochlorothiazide (HYDRODIURIL) 25 MG tablet Take 1 tablet (25 mg total) by mouth daily. (Patient not taking: Reported on 11/30/2022) 90 tablet 3   Semaglutide,0.25 or 0.5MG /DOS, (OZEMPIC, 0.25 OR 0.5 MG/DOSE,) 2 MG/3ML SOPN Inject 0.25 mg into the skin every 7 days for 28 days, THEN 0.5 mg every 7 days. 9 mL 3   No current facility-administered medications on file prior to visit.    No Known Allergies    Lab Results HIV 1 RNA Quant (copies/mL)  Date Value  11/30/2022 485,000 (H)   CD4 T Cell Abs (/uL)  Date Value  11/30/2022 64 (L)   No results found for: "HIV1GENOSEQ" Lab Results  Component Value Date   WBC 3.2 (L) 11/30/2022   HGB 13.7 11/30/2022   HCT 40.7 11/30/2022   MCV 85.1 11/30/2022   PLT 203 11/30/2022    Lab Results  Component Value Date  CREATININE 0.82 11/30/2022   BUN 11 11/30/2022   NA 141 11/30/2022   K 3.6 11/30/2022   CL 103 11/30/2022   CO2 29 11/30/2022   Lab Results  Component Value Date   ALT 48 (H) 11/30/2022   AST 52 (H) 11/30/2022   ALKPHOS 87 10/05/2022   BILITOT 0.4 11/30/2022    Lab Results  Component Value Date   CHOL 106 11/30/2022   TRIG 104 11/30/2022   HDL 29 (L) 11/30/2022   LDLCALC 58 11/30/2022   Lab Results  Component Value Date   HAV REACTIVE (A) 11/30/2022   Lab Results  Component Value Date   HEPBSAG NON-REACTIVE 11/30/2022   HEPBSAB NON-REACTIVE 11/30/2022   Lab Results  Component Value Date   HCVAB NEGATIVE 01/20/2017   Lab  Results  Component Value Date   CHLAMYDIAWP Negative 11/30/2022   CHLAMYDIAWP Negative 11/30/2022   CHLAMYDIAWP Negative 11/30/2022   N Negative 11/30/2022   N Negative 11/30/2022   N Negative 11/30/2022   No results found for: "GCPROBEAPT" No results found for: "QUANTGOLD"  Assessment/Plan #HIV -CD4 64, VL , on Biktarvy started on 4/10. No missed doses, some nausea. Plans to take it take it at night.  -Labs today, -start bactrim  Plan -Follow up in one month -Continue Biktarvy -Labs today  #Trouble with dealing with Dx -Refer to psychiatry  #Vaccination COVID x2, booster 11/30/22 Flu 11/30/22 Monkeypox not UTD PCV not UTD Meningitis not UTD HepA immune HEpB today, needs 12/14/22 Tdap 11/24/2017 Shingles     #Dm-A1c 6.7 -Management per primary   #Health maintenance -Quantiferon 11/30/22 nr -RPR4/10/24 NR -HCV 11/30/22 NR -GC urine/oral  11/30/22 negative -Lipid 11/30/22 -Dysplasia screen  M 11/30/22 ASCUS, HPV positive(high risk). Referred to GI for HRA. -Colonoscopy 5 years ago for mucus per rectum, pt reports found internal hemmhroids    Danelle Earthly, MD [facility]   I have personally spent 50 minutes involved in face-to-face and non-face-to-face activities for this patient on the day of the visit. Professional time spent includes the following activities: Preparing to see the patient (review of tests), Obtaining and/or reviewing separately obtained history (admission/discharge record), Performing a medically appropriate examination and/or evaluation , Ordering medications/tests/procedures, referring and communicating with other health care professionals, Documenting clinical information in the EMR, Independently interpreting results (not separately reported), Communicating results to the patient/family/caregiver, Counseling and educating the patient/family/caregiver and Care coordination (not separately reported).

## 2022-12-15 LAB — COMPLETE METABOLIC PANEL WITH GFR
ALT: 54 U/L — ABNORMAL HIGH (ref 9–46)
Albumin: 4.1 g/dL (ref 3.6–5.1)
CO2: 27 mmol/L (ref 20–32)
Chloride: 102 mmol/L (ref 98–110)
Creat: 0.89 mg/dL (ref 0.60–1.29)
Globulin: 5.2 g/dL (calc) — ABNORMAL HIGH (ref 1.9–3.7)
Glucose, Bld: 75 mg/dL (ref 65–99)
Sodium: 138 mmol/L (ref 135–146)
Total Bilirubin: 0.5 mg/dL (ref 0.2–1.2)

## 2022-12-16 LAB — COMPLETE METABOLIC PANEL WITH GFR
AG Ratio: 0.8 (calc) — ABNORMAL LOW (ref 1.0–2.5)
AST: 52 U/L — ABNORMAL HIGH (ref 10–40)
Alkaline phosphatase (APISO): 67 U/L (ref 36–130)
BUN: 8 mg/dL (ref 7–25)
Calcium: 9.5 mg/dL (ref 8.6–10.3)
Potassium: 3.8 mmol/L (ref 3.5–5.3)
Total Protein: 9.3 g/dL — ABNORMAL HIGH (ref 6.1–8.1)
eGFR: 110 mL/min/{1.73_m2} (ref >=60)

## 2022-12-16 LAB — CBC WITH DIFFERENTIAL/PLATELET
Basophils Absolute: 31 cells/uL (ref 0–200)
Basophils Relative: 0.7 %
Eosinophils Relative: 2.1 %
HCT: 41 % (ref 38.5–50.0)
Hemoglobin: 13.6 g/dL (ref 13.2–17.1)
MCH: 28.2 pg (ref 27.0–33.0)
MCV: 85.1 fL (ref 80.0–100.0)
Monocytes Relative: 7.3 %
Neutrophils Relative %: 60.3 %
RBC: 4.82 10*6/uL (ref 4.20–5.80)
Total Lymphocyte: 29.6 %

## 2022-12-16 LAB — HIV-1 RNA QUANT-NO REFLEX-BLD
HIV 1 RNA Quant: 5780 Copies/mL — ABNORMAL HIGH
HIV-1 RNA Quant, Log: 3.76 Log cps/mL — ABNORMAL HIGH

## 2022-12-21 ENCOUNTER — Other Ambulatory Visit (HOSPITAL_COMMUNITY): Payer: Self-pay

## 2022-12-21 ENCOUNTER — Other Ambulatory Visit: Payer: Self-pay | Admitting: Internal Medicine

## 2022-12-21 ENCOUNTER — Other Ambulatory Visit: Payer: Self-pay

## 2022-12-21 DIAGNOSIS — B20 Human immunodeficiency virus [HIV] disease: Secondary | ICD-10-CM

## 2022-12-21 MED ORDER — BICTEGRAVIR-EMTRICITAB-TENOFOV 50-200-25 MG PO TABS
1.0000 | ORAL_TABLET | Freq: Every day | ORAL | 5 refills | Status: DC
Start: 2022-12-21 — End: 2023-06-07
  Filled 2022-12-21: qty 30, 30d supply, fill #0
  Filled 2023-01-18: qty 30, 30d supply, fill #1
  Filled 2023-02-13: qty 30, 30d supply, fill #2
  Filled 2023-03-13 – 2023-03-17 (×3): qty 30, 30d supply, fill #3
  Filled 2023-04-12: qty 30, 30d supply, fill #4
  Filled 2023-05-09: qty 30, 30d supply, fill #5

## 2022-12-22 ENCOUNTER — Other Ambulatory Visit (HOSPITAL_COMMUNITY): Payer: Self-pay

## 2022-12-23 ENCOUNTER — Other Ambulatory Visit: Payer: Self-pay

## 2022-12-31 ENCOUNTER — Other Ambulatory Visit: Payer: Self-pay | Admitting: Family Medicine

## 2022-12-31 ENCOUNTER — Other Ambulatory Visit: Payer: Self-pay | Admitting: Family

## 2022-12-31 DIAGNOSIS — E663 Overweight: Secondary | ICD-10-CM

## 2022-12-31 DIAGNOSIS — H1033 Unspecified acute conjunctivitis, bilateral: Secondary | ICD-10-CM

## 2022-12-31 DIAGNOSIS — E781 Pure hyperglyceridemia: Secondary | ICD-10-CM

## 2022-12-31 DIAGNOSIS — K760 Fatty (change of) liver, not elsewhere classified: Secondary | ICD-10-CM

## 2022-12-31 DIAGNOSIS — I10 Essential (primary) hypertension: Secondary | ICD-10-CM

## 2022-12-31 DIAGNOSIS — E119 Type 2 diabetes mellitus without complications: Secondary | ICD-10-CM

## 2023-01-04 ENCOUNTER — Encounter: Payer: Self-pay | Admitting: Family Medicine

## 2023-01-04 ENCOUNTER — Ambulatory Visit: Payer: BC Managed Care – PPO | Admitting: Family Medicine

## 2023-01-04 VITALS — BP 145/90 | HR 71 | Temp 98.5°F | Ht 68.0 in | Wt 188.0 lb

## 2023-01-04 DIAGNOSIS — F430 Acute stress reaction: Secondary | ICD-10-CM

## 2023-01-04 DIAGNOSIS — I152 Hypertension secondary to endocrine disorders: Secondary | ICD-10-CM

## 2023-01-04 DIAGNOSIS — E1159 Type 2 diabetes mellitus with other circulatory complications: Secondary | ICD-10-CM | POA: Diagnosis not present

## 2023-01-04 DIAGNOSIS — E1169 Type 2 diabetes mellitus with other specified complication: Secondary | ICD-10-CM

## 2023-01-04 DIAGNOSIS — R4589 Other symptoms and signs involving emotional state: Secondary | ICD-10-CM | POA: Diagnosis not present

## 2023-01-04 DIAGNOSIS — E119 Type 2 diabetes mellitus without complications: Secondary | ICD-10-CM | POA: Insufficient documentation

## 2023-01-04 DIAGNOSIS — B977 Papillomavirus as the cause of diseases classified elsewhere: Secondary | ICD-10-CM

## 2023-01-04 DIAGNOSIS — B20 Human immunodeficiency virus [HIV] disease: Secondary | ICD-10-CM

## 2023-01-04 DIAGNOSIS — Z7985 Long-term (current) use of injectable non-insulin antidiabetic drugs: Secondary | ICD-10-CM | POA: Insufficient documentation

## 2023-01-04 LAB — BAYER DCA HB A1C WAIVED: HB A1C (BAYER DCA - WAIVED): 5.6 % (ref 4.8–5.6)

## 2023-01-04 MED ORDER — SEMAGLUTIDE (1 MG/DOSE) 4 MG/3ML ~~LOC~~ SOPN
1.0000 mg | PEN_INJECTOR | SUBCUTANEOUS | 3 refills | Status: DC
Start: 2023-01-04 — End: 2023-03-24

## 2023-01-04 NOTE — Progress Notes (Signed)
Subjective: CC:DM PCP: [name], DO Mark Maddox is a 42 y.o. male presenting to clinic today for:  1. Type 2 Diabetes with hypertension:  Patient reports compliance with Ozempic 0.5 mg weekly.  He seems to be doing well with the medication and does not report any adverse GI side effects.  Since our last visit he was unfortunately diagnosed with HIV and has been started on Biktarvy which did cause some GI disturbance but that has since settled down.  He is working on getting his levels undetectable and is closely followed by infectious disease for this now.  He notes that this of course was quite a shock to him.  He still has not told his mother about the diagnosis and this does weigh on his.  A little bit.  He has really tried to keep a positive attitude and outlook on everything though and is really just trying to keep himself in the best health that he can.  He does worry about his weight, which has not responded to the semaglutide as he had hoped.  Last eye exam: needs Last foot exam: needs Last A1c:  Lab Results  Component Value Date   HGBA1C 6.7 (H) 10/05/2022   Nephropathy screen indicated?: needs Last flu, zoster and/or pneumovax:  Immunization History  Administered Date(s) Administered   COVID-19, mRNA, vaccine(Comirnaty)12 years and older 11/30/2022   Hepb-cpg 12/14/2022   Influenza,inj,Quad PF,6+ Mos 11/30/2022   Moderna Sars-Covid-2 Vaccination 01/04/2020, 02/18/2020   Tdap 11/24/2017    ROS: no CP, SOB, edema or visual disturbance reported.  ROS: Per HPI  No Known Allergies Past Medical History:  Diagnosis Date   Asthma    had asthma as a child   Diabetes mellitus without complication (HCC)    Enlarged prostate    Hypertension    hx of    Inguinal hernia bilateral, non-recurrent     Current Outpatient Medications:    amLODipine (NORVASC) 10 MG tablet, Take 1 tablet (10 mg total) by mouth daily., Disp: 90 tablet, Rfl: 3   azelastine  (ASTELIN) 0.1 % nasal spray, Place 1 spray into both nostrils 2 (two) times daily., Disp: 30 mL, Rfl: 12   bictegravir-emtricitabine-tenofovir AF (BIKTARVY) 50-200-25 MG TABS tablet, Take 1 tablet by mouth daily. Try to take at the same time each day with or without food., Disp: 30 tablet, Rfl: 5   hydrochlorothiazide (HYDRODIURIL) 25 MG tablet, Take 1 tablet (25 mg total) by mouth daily., Disp: 90 tablet, Rfl: 3   Semaglutide, 1 MG/DOSE, 4 MG/3ML SOPN, Inject 1 mg as directed every 7 (seven) days., Disp: 9 mL, Rfl: 3   sulfamethoxazole-trimethoprim (BACTRIM DS) 800-160 MG tablet, Take 1 tablet by mouth daily., Disp: 30 tablet, Rfl: 5 Social History   Socioeconomic History   Marital status: Single    Spouse name: Not on file   Number of children: 0   Years of education: Not on file   Highest education level: Not on file  Occupational History   Occupation: truck driver  Tobacco Use   Smoking status: Never    Passive exposure: Past   Smokeless tobacco: Never  Vaping Use   Vaping Use: Never used  Substance and Sexual Activity   Alcohol use: No   Drug use: No   Sexual activity: Not Currently    Partners: Male    Comment: 1 year  Other Topics Concern   Not on file  Social History Narrative   Not on file  Social Determinants of Health   Financial Resource Strain: Not on file  Food Insecurity: Not on file  Transportation Needs: Not on file  Physical Activity: Not on file  Stress: Not on file  Social Connections: Not on file  Intimate Partner Violence: Not on file   Family History  Problem Relation Age of Onset   Diabetes Maternal Grandmother    Heart disease Maternal Grandfather        heart attack   Diabetes Maternal Grandfather    Prostate cancer Paternal Grandfather     Objective: Office vital signs reviewed. BP (!) 145/90   Pulse 71   Temp 98.5 F (36.9 C)   Ht 5\' 8"  (1.727 m)   Wt 188 lb (85.3 kg)   SpO2 96%   BMI 28.59 kg/m   Physical Examination:   General: Awake, alert, well nourished, No acute distress HEENT: sclera white, MMM Cardio: regular rate and rhythm, S1S2 heard, no murmurs appreciated Pulm: clear to auscultation bilaterally, no wheezes, rhonchi or rales; normal work of breathing on room air Psych: mood stable, speech normal. Affect appropriate.     01/04/2023   11:24 AM 12/14/2022    8:58 AM 10/05/2022    2:17 PM  Depression screen PHQ 2/9  Decreased Interest 1 1 0  Down, Depressed, Hopeless 0 0 0  PHQ - 2 Score 1 1 0  Altered sleeping 0 1 0  Tired, decreased energy 1 2 0  Change in appetite 0 2 0  Feeling bad or failure about yourself  1 1 0  Trouble concentrating 1 2 0  Moving slowly or fidgety/restless 0 1 0  Suicidal thoughts 0 0 0  PHQ-9 Score 4 10 0  Difficult doing work/chores Somewhat difficult  Not difficult at all      01/04/2023   11:24 AM 10/05/2022    2:17 PM 04/21/2022    8:38 AM 02/24/2022   11:02 AM  GAD 7 : Generalized Anxiety Score  Nervous, Anxious, on Edge 1 0 0 0  Control/stop worrying 0 0 0 0  Worry too much - different things 1 0 0 0  Trouble relaxing 1 0 0 0  Restless 0 0 0 0  Easily annoyed or irritable 3 0 0 2  Afraid - awful might happen 1 0 0 0  Total GAD 7 Score 7 0 0 2  Anxiety Difficulty Somewhat difficult Not difficult at all Not difficult at all Not difficult at all      Assessment/ Plan: 42 y.o. male   Controlled type 2 diabetes mellitus with other specified complication, without long-term current use of insulin (HCC) - Plan: Bayer DCA Hb A1c Waived, Semaglutide, 1 MG/DOSE, 4 MG/3ML SOPN  Hypertension associated with diabetes (HCC)  Anxiety about health  Stress reaction  HIV disease (HCC)  High risk HPV infection  Sugar now under excellent control.  Will advance the semaglutide to 1 mg weekly in efforts to achieve clinical goals.  Much of the visit was spent discussing his new diagnosis below.  Will plan for diabetic foot exam and urine microalbumin at next  visit.  We also need to consider addition of statin therapy in this patient with known fatty liver and now diagnosis of diabetes  Blood pressure not at goal.  He is compliant with medication I do question if perhaps the emotional state that he said may be impacting.  If persistently above 140/90, may need to consider addition of losartan or similar.  Right now he seems  to be self coping with the anxiety and stress related to recent diagnoses.  He seems to be well plugged into his infectious disease specialist and is trying to keep a positive outlook on things.  I have encouraged him to please contact me if there is anything I can do to help him.  If he ends up needing referral for counseling I did inform him that we have integrated behavioral health care starting at the end of the month we can coordinate this for him.  I would like to see him back in 3 months rather than 6 just to make sure that is doing okay from a mental health standpoint and tolerating the recent increase in semaglutide dose without difficulty  Orders Placed This Encounter  Procedures   Bayer DCA Hb A1c Waived   Meds ordered this encounter  Medications   Semaglutide, 1 MG/DOSE, 4 MG/3ML SOPN    Sig: Inject 1 mg as directed every 7 (seven) days.    Dispense:  9 mL    Refill:  3     Doyne Micke Hulen Skains, DO [facility] [phone]

## 2023-01-18 ENCOUNTER — Other Ambulatory Visit: Payer: Self-pay

## 2023-01-24 ENCOUNTER — Other Ambulatory Visit (HOSPITAL_COMMUNITY): Payer: Self-pay

## 2023-01-24 ENCOUNTER — Ambulatory Visit: Payer: BC Managed Care – PPO | Admitting: Internal Medicine

## 2023-01-24 ENCOUNTER — Encounter: Payer: Self-pay | Admitting: Internal Medicine

## 2023-01-24 ENCOUNTER — Other Ambulatory Visit: Payer: Self-pay

## 2023-01-24 VITALS — BP 129/85 | HR 80 | Resp 16 | Ht 68.0 in | Wt 185.0 lb

## 2023-01-24 DIAGNOSIS — Z23 Encounter for immunization: Secondary | ICD-10-CM | POA: Diagnosis not present

## 2023-01-24 DIAGNOSIS — B20 Human immunodeficiency virus [HIV] disease: Secondary | ICD-10-CM | POA: Diagnosis not present

## 2023-01-24 LAB — CBC WITH DIFFERENTIAL/PLATELET
Hemoglobin: 13.7 g/dL (ref 13.2–17.1)
MCH: 29.2 pg (ref 27.0–33.0)
Monocytes Relative: 8.1 %
Total Lymphocyte: 44.3 %

## 2023-01-24 NOTE — Addendum Note (Signed)
Addended by: Clayborne Artist A on: 01/24/2023 11:59 AM   Modules accepted: Orders

## 2023-01-24 NOTE — Progress Notes (Signed)
[facility]     HPI:  Mark Maddox is a 42 y.o. male presents for HIV management. Newly Cx at helath department, HIV 1+, on 10/16/22. 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 Pt works at [employer]. Pt has not been sexually active since July, 2023. Ex was drug addict. Pt has been tested for HIV in 2012 in negative   Today: Doing well tolerating ART and bactrim. Told his family in regards to diagnosis. Does not wish to seek counseling.  PMHx: DM now ozempic Date of diagnosis: 2/25/'24 ART exposure: no Past Ois: no Risk factors: MSM,  Partners in last 2months 0, in the last 12 months 1.  Anal sex receptive y, insertivey . Contraception sometimes Oral sex, contraception no Vaginal penile sex, contraception no   Social: non drinker/does not smoke ciggerettes Occupation: Occupational hygienist Housing: leaves in house/mom Support: Family aware of diagnosis Understanding of HIV: fair Etoh/drug/tobacco use:  Past Medical History:  Diagnosis Date   Asthma    had asthma as a child   Diabetes mellitus without complication (HCC)    Enlarged prostate    Hypertension    hx of    Inguinal hernia bilateral, non-recurrent     Past Surgical History:  Procedure Laterality Date   dental extractions     wisdom teeth   HERNIA REPAIR     INGUINAL HERNIA REPAIR  07/04/2012   Procedure: LAPAROSCOPIC BILATERAL INGUINAL HERNIA REPAIR;  Surgeon: Axel Filler, MD;  Location: [facility] OR;  Service: General;  Laterality: Bilateral;   INSERTION OF MESH  07/04/2012   Procedure: INSERTION OF MESH;  Surgeon: Axel Filler, MD;  Location: [facility] OR;  Service: General;  Laterality: Bilateral;    Family History  Problem Relation Age of Onset   Diabetes Maternal Grandmother    Heart disease Maternal Grandfather        heart attack   Diabetes Maternal Grandfather    Prostate cancer Paternal Grandfather    Current Outpatient Medications on File  Prior to Visit  Medication Sig Dispense Refill   amLODipine (NORVASC) 10 MG tablet Take 1 tablet (10 mg total) by mouth daily. 90 tablet 3   azelastine (ASTELIN) 0.1 % nasal spray Place 1 spray into both nostrils 2 (two) times daily. 30 mL 12   bictegravir-emtricitabine-tenofovir AF (BIKTARVY) 50-200-25 MG TABS tablet Take 1 tablet by mouth daily. Try to take at the same time each day with or without food. 30 tablet 5   hydrochlorothiazide (HYDRODIURIL) 25 MG tablet Take 1 tablet (25 mg total) by mouth daily. 90 tablet 3   Semaglutide, 1 MG/DOSE, 4 MG/3ML SOPN Inject 1 mg as directed every 7 (seven) days. 9 mL 3   sulfamethoxazole-trimethoprim (BACTRIM DS) 800-160 MG tablet Take 1 tablet by mouth daily. 30 tablet 5   No current facility-administered medications on file prior to visit.    No Known Allergies    Lab Results HIV 1 RNA Quant  Date Value  12/14/2022 5,780 Copies/mL (H)  11/30/2022 485,000 copies/mL (H)   CD4 T Cell Abs (/uL)  Date Value  11/30/2022 64 (L)   No results found for: "HIV1GENOSEQ" Lab Results  Component Value Date   WBC 4.4 12/14/2022   HGB 13.6 12/14/2022   HCT 41.0 12/14/2022   MCV 85.1 12/14/2022   PLT 234 12/14/2022    Lab Results  Component Value Date   CREATININE 0.89 12/14/2022   BUN 8 12/14/2022  NA 138 12/14/2022   K 3.8 12/14/2022   CL 102 12/14/2022   CO2 27 12/14/2022   Lab Results  Component Value Date   ALT 54 (H) 12/14/2022   AST 52 (H) 12/14/2022   ALKPHOS 87 10/05/2022   BILITOT 0.5 12/14/2022    Lab Results  Component Value Date   CHOL 106 11/30/2022   TRIG 104 11/30/2022   HDL 29 (L) 11/30/2022   LDLCALC 58 11/30/2022   Lab Results  Component Value Date   HAV REACTIVE (A) 11/30/2022   Lab Results  Component Value Date   HEPBSAG NON-REACTIVE 11/30/2022   HEPBSAB NON-REACTIVE 11/30/2022   Lab Results  Component Value Date   HCVAB NEGATIVE 01/20/2017   Lab Results  Component Value Date   CHLAMYDIAWP  Negative 11/30/2022   CHLAMYDIAWP Negative 11/30/2022   CHLAMYDIAWP Negative 11/30/2022   N Negative 11/30/2022   N Negative 11/30/2022   N Negative 11/30/2022   No results found for: "GCPROBEAPT" No results found for: "QUANTGOLD"  Assessment/Plan #HIV -CD4 64(7%) on 4/10, [id number], on 4/24 --on biktarvya and bactrim -Interested in injecitbles -Labs today, -Follow-up in 3 months      #Vaccination COVID x2, booster 11/30/22 Flu 11/30/22 Monkeypox not UTD PCV 20 today 6/4/204  Meningitis not UTD HepA immune HEpB 1st dose  12/14/22, 2nd dose today 01/24/23 Tdap 11/24/2017 Shingles     #Dm-A1c 6.7 -Management per primary   #Health maintenance -Quantiferon 11/30/22 nr -RPR4/10/24 NR -HCV 11/30/22 NR -GC urine/oral  11/30/22 negative -Lipid 11/30/22 -Dysplasia screen  M 11/30/22 ASCUS, HPV positive(high risk). Referred to GI for HRA. -Colonoscopy 5 years ago for mucus per rectum, pt reports found internal hemmhroids   Danelle Earthly, MD [facility]   I have personally spent 42 minutes involved in face-to-face and non-face-to-face activities for this patient on the day of the visit. Professional time spent includes the following activities: Preparing to see the patient (review of tests), Obtaining and/or reviewing separately obtained history (admission/discharge record), Performing a medically appropriate examination and/or evaluation , Ordering medications/tests/procedures, referring and communicating with other health care professionals, Documenting clinical information in the EMR, Independently interpreting results (not separately reported), Communicating results to the patient/family/caregiver, Counseling and educating the patient/family/caregiver and Care coordination (not separately reported).

## 2023-01-25 LAB — T-HELPER CELLS (CD4) COUNT (NOT AT ARMC)
CD4 % Helper T Cell: 9 % — ABNORMAL LOW (ref 33–65)
CD4 T Cell Abs: 138 /uL — ABNORMAL LOW (ref 400–1790)

## 2023-01-25 LAB — COMPLETE METABOLIC PANEL WITH GFR
Albumin: 4.3 g/dL (ref 3.6–5.1)
BUN: 13 mg/dL (ref 7–25)
Chloride: 100 mmol/L (ref 98–110)

## 2023-01-27 LAB — HIV-1 RNA QUANT-NO REFLEX-BLD
HIV 1 RNA Quant: 230 Copies/mL — ABNORMAL HIGH
HIV-1 RNA Quant, Log: 2.36 Log cps/mL — ABNORMAL HIGH

## 2023-01-27 LAB — CBC WITH DIFFERENTIAL/PLATELET
Absolute Monocytes: 332 cells/uL (ref 200–950)
Basophils Absolute: 41 cells/uL (ref 0–200)
Basophils Relative: 1 %
Eosinophils Absolute: 90 cells/uL (ref 15–500)
Eosinophils Relative: 2.2 %
HCT: 41 % (ref 38.5–50.0)
Lymphs Abs: 1816 cells/uL (ref 850–3900)
MCHC: 33.4 g/dL (ref 32.0–36.0)
MCV: 87.4 fL (ref 80.0–100.0)
MPV: 8.7 fL (ref 7.5–12.5)
Neutro Abs: 1820 cells/uL (ref 1500–7800)
Neutrophils Relative %: 44.4 %
Platelets: 228 10*3/uL (ref 140–400)
RBC: 4.69 10*6/uL (ref 4.20–5.80)
RDW: 14 % (ref 11.0–15.0)
WBC: 4.1 10*3/uL (ref 3.8–10.8)

## 2023-01-27 LAB — COMPLETE METABOLIC PANEL WITH GFR
AG Ratio: 0.8 (calc) — ABNORMAL LOW (ref 1.0–2.5)
ALT: 58 U/L — ABNORMAL HIGH (ref 9–46)
AST: 73 U/L — ABNORMAL HIGH (ref 10–40)
Alkaline phosphatase (APISO): 69 U/L (ref 36–130)
CO2: 28 mmol/L (ref 20–32)
Calcium: 9.7 mg/dL (ref 8.6–10.3)
Creat: 1.06 mg/dL (ref 0.60–1.29)
Globulin: 5.3 g/dL (calc) — ABNORMAL HIGH (ref 1.9–3.7)
Glucose, Bld: 90 mg/dL (ref 65–99)
Potassium: 3.7 mmol/L (ref 3.5–5.3)
Sodium: 137 mmol/L (ref 135–146)
Total Bilirubin: 0.4 mg/dL (ref 0.2–1.2)
Total Protein: 9.6 g/dL — ABNORMAL HIGH (ref 6.1–8.1)
eGFR: 90 mL/min/{1.73_m2} (ref >=60)

## 2023-02-02 NOTE — Progress Notes (Signed)
The ASCVD Risk score (Arnett DK, et al., 2019) failed to calculate for the following reasons:   The valid total cholesterol range is 130 to 320 mg/dL  Sandie Ano, RN

## 2023-02-13 ENCOUNTER — Other Ambulatory Visit: Payer: Self-pay

## 2023-02-15 ENCOUNTER — Other Ambulatory Visit (HOSPITAL_COMMUNITY): Payer: Self-pay

## 2023-02-16 ENCOUNTER — Other Ambulatory Visit (HOSPITAL_COMMUNITY): Payer: Self-pay

## 2023-03-10 ENCOUNTER — Other Ambulatory Visit (HOSPITAL_COMMUNITY): Payer: Self-pay

## 2023-03-13 ENCOUNTER — Other Ambulatory Visit (HOSPITAL_COMMUNITY): Payer: Self-pay

## 2023-03-14 ENCOUNTER — Encounter: Payer: Self-pay | Admitting: Family Medicine

## 2023-03-14 ENCOUNTER — Ambulatory Visit: Payer: BC Managed Care – PPO | Admitting: Family Medicine

## 2023-03-14 VITALS — BP 113/72 | HR 84 | Temp 98.2°F | Ht 68.0 in | Wt 185.0 lb

## 2023-03-14 DIAGNOSIS — R110 Nausea: Secondary | ICD-10-CM

## 2023-03-14 DIAGNOSIS — J00 Acute nasopharyngitis [common cold]: Secondary | ICD-10-CM | POA: Diagnosis not present

## 2023-03-14 DIAGNOSIS — J029 Acute pharyngitis, unspecified: Secondary | ICD-10-CM | POA: Diagnosis not present

## 2023-03-14 LAB — RAPID STREP SCREEN (MED CTR MEBANE ONLY): Strep Gp A Ag, IA W/Reflex: NEGATIVE

## 2023-03-14 LAB — CULTURE, GROUP A STREP

## 2023-03-14 MED ORDER — ONDANSETRON 4 MG PO TBDP
4.0000 mg | ORAL_TABLET | Freq: Three times a day (TID) | ORAL | 0 refills | Status: DC | PRN
Start: 2023-03-14 — End: 2024-07-02

## 2023-03-14 MED ORDER — LEVOCETIRIZINE DIHYDROCHLORIDE 5 MG PO TABS
5.0000 mg | ORAL_TABLET | Freq: Every evening | ORAL | 3 refills | Status: DC | PRN
Start: 2023-03-14 — End: 2024-07-02

## 2023-03-14 NOTE — Progress Notes (Signed)
Subjective: CC: ?sinus infection PCP: [name], DO GNF:AOZHY D Reaver is a 42 y.o. male presenting to clinic today for:  1. Sinus drainage Patient reports onset of sore throat on Sunday.  He had subjective fevers. Mild nonproductive cough, more throat irritation. No wheezing or shortness of breath. No known sick contacts.  Did have 1 episode of vomiting yesterday. Reports some fatigue.  No home treatments yet.  Chronically treated with septra by ID.   ROS: Per HPI  No Known Allergies Past Medical History:  Diagnosis Date   Asthma    had asthma as a child   Diabetes mellitus without complication (HCC)    Enlarged prostate    HIV disease (HCC)    Hypertension    hx of    Inguinal hernia bilateral, non-recurrent    Obesity     Current Outpatient Medications:    amLODipine (NORVASC) 10 MG tablet, Take 1 tablet (10 mg total) by mouth daily., Disp: 90 tablet, Rfl: 3   bictegravir-emtricitabine-tenofovir AF (BIKTARVY) 50-200-25 MG TABS tablet, Take 1 tablet by mouth daily. Try to take at the same time each day with or without food., Disp: 30 tablet, Rfl: 5   hydrochlorothiazide (HYDRODIURIL) 25 MG tablet, Take 1 tablet (25 mg total) by mouth daily., Disp: 90 tablet, Rfl: 3   levocetirizine (XYZAL) 5 MG tablet, Take 1 tablet (5 mg total) by mouth at bedtime as needed for allergies (drainage)., Disp: 90 tablet, Rfl: 3   ondansetron (ZOFRAN-ODT) 4 MG disintegrating tablet, Take 1 tablet (4 mg total) by mouth every 8 (eight) hours as needed for nausea or vomiting., Disp: 20 tablet, Rfl: 0   Semaglutide, 1 MG/DOSE, 4 MG/3ML SOPN, Inject 1 mg as directed every 7 (seven) days., Disp: 9 mL, Rfl: 3   sulfamethoxazole-trimethoprim (BACTRIM DS) 800-160 MG tablet, Take 1 tablet by mouth daily., Disp: 30 tablet, Rfl: 5   azelastine (ASTELIN) 0.1 % nasal spray, Place 1 spray into both nostrils 2 (two) times daily. (Patient not taking: Reported on 03/14/2023), Disp: 30 mL, Rfl: 12 Social  History   Socioeconomic History   Marital status: Single    Spouse name: Not on file   Number of children: 0   Years of education: Not on file   Highest education level: Not on file  Occupational History   Occupation: truck driver  Tobacco Use   Smoking status: Never    Passive exposure: Past   Smokeless tobacco: Never  Vaping Use   Vaping status: Never Used  Substance and Sexual Activity   Alcohol use: No   Drug use: No   Sexual activity: Not Currently    Partners: Male    Comment: 1 year  Other Topics Concern   Not on file  Social History Narrative   Not on file   Social Determinants of Health   Financial Resource Strain: Not on file  Food Insecurity: Not on file  Transportation Needs: Not on file  Physical Activity: Not on file  Stress: Not on file  Social Connections: Not on file  Intimate Partner Violence: Not on file   Family History  Problem Relation Age of Onset   Diabetes Maternal Grandmother    Heart disease Maternal Grandfather        heart attack   Diabetes Maternal Grandfather    Prostate cancer Paternal Grandfather     Objective: Office vital signs reviewed. BP 113/72   Pulse 84   Temp 98.2 F (36.8 C)   Ht  5\' 8"  (1.727 m)   Wt 185 lb (83.9 kg)   SpO2 95%   BMI 28.13 kg/m   Physical Examination:  General: Awake, alert, well nourished, No acute distress HEENT: Normal    Neck: No masses palpated. No lymphadenopathy.  Slight fullness in the submandibular glands but no [place] adenopathy    Ears: Tympanic membranes intact, normal light reflex, no erythema, no bulging    Eyes: PERRLA, extraocular membranes intact, sclera white    Nose: nasal turbinates moist, clear nasal discharge    Throat: moist mucus membranes, no erythema, right sided tonsillar exudate present.  Airway is patent Cardio: regular rate and rhythm, S1S2 heard, no murmurs appreciated Pulm: clear to auscultation bilaterally, no wheezes, rhonchi or rales; normal work of  breathing on room air  Assessment/ Plan: 42 y.o. male   Acute rhinitis - Plan: levocetirizine (XYZAL) 5 MG tablet  Sore throat - Plan: Rapid Strep Screen ([facility] ONLY)  Nausea - Plan: Novel Coronavirus, NAA ([facility]), ondansetron (ZOFRAN-ODT) 4 MG disintegrating tablet  Rapid strep negative.  Suspect allergic vs viral.  Check COVID PCR.  Xyzal at bedtime prn drainage.  No evidence of bacterial infection at this time but we reviewed what that might present as.  Zofran if needed for nausea. Keep follow up 03/24/2023  Orders Placed This Encounter  Procedures   Rapid Strep Screen ([facility] ONLY)   Novel Coronavirus, NAA ([facility])    Order Specific Question:   Previously tested for COVID-19    Answer:   No    Order Specific Question:   Resident in a congregate (group) care setting    Answer:   No    Order Specific Question:   Is the patient student?    Answer:   No    Order Specific Question:   Employed in healthcare setting    Answer:   No    Order Specific Question:   Has patient completed COVID vaccination(s) (2 doses of Pfizer/Moderna 1 dose of [employer])    Answer:   No    Order Specific Question:   Release to patient    Answer:   Immediate   Meds ordered this encounter  Medications   levocetirizine (XYZAL) 5 MG tablet    Sig: Take 1 tablet (5 mg total) by mouth at bedtime as needed for allergies (drainage).    Dispense:  90 tablet    Refill:  3   ondansetron (ZOFRAN-ODT) 4 MG disintegrating tablet    Sig: Take 1 tablet (4 mg total) by mouth every 8 (eight) hours as needed for nausea or vomiting.    Dispense:  20 tablet    Refill:  0     Britanni Yarde Hulen Skains, DO [facility] [phone]

## 2023-03-14 NOTE — Patient Instructions (Signed)

## 2023-03-15 LAB — NOVEL CORONAVIRUS, NAA: SARS-CoV-2, NAA: NOT DETECTED

## 2023-03-17 ENCOUNTER — Other Ambulatory Visit: Payer: Self-pay

## 2023-03-17 ENCOUNTER — Other Ambulatory Visit (HOSPITAL_COMMUNITY): Payer: Self-pay

## 2023-03-20 ENCOUNTER — Other Ambulatory Visit: Payer: Self-pay

## 2023-03-20 ENCOUNTER — Other Ambulatory Visit (HOSPITAL_COMMUNITY): Payer: Self-pay

## 2023-03-21 ENCOUNTER — Other Ambulatory Visit (HOSPITAL_COMMUNITY): Payer: Self-pay

## 2023-03-22 ENCOUNTER — Encounter: Payer: Self-pay | Admitting: Physician Assistant

## 2023-03-22 ENCOUNTER — Ambulatory Visit: Payer: BC Managed Care – PPO | Admitting: Physician Assistant

## 2023-03-22 VITALS — BP 118/80 | HR 88 | Ht 68.0 in | Wt 187.0 lb

## 2023-03-22 DIAGNOSIS — B20 Human immunodeficiency virus [HIV] disease: Secondary | ICD-10-CM

## 2023-03-22 DIAGNOSIS — B977 Papillomavirus as the cause of diseases classified elsewhere: Secondary | ICD-10-CM | POA: Diagnosis not present

## 2023-03-22 NOTE — Patient Instructions (Signed)
_______________________________________________________  If your blood pressure at your visit was 140/90 or greater, please contact your primary care physician to follow up on this.  _______________________________________________________  If you are age 43 or older, your body mass index should be between 23-30. Your Body mass index is 28.43 kg/m. If this is out of the aforementioned range listed, please consider follow up with your Primary Care Provider.  If you are age 51 or younger, your body mass index should be between 19-25. Your Body mass index is 28.43 kg/m. If this is out of the aformentioned range listed, please consider follow up with your Primary Care Provider.   ________________________________________________________  The [facility] providers would like to encourage you to use [facility] to communicate with providers for non-urgent requests or questions.  Due to long hold times on the telephone, sending your provider a message by [facility] may be a faster and more efficient way to get a response.  Please allow 48 business hours for a response.  Please remember that this is for non-urgent requests.  _______________________________________________________  Bonita Quin have been scheduled for an endoscopy. Please follow written instructions given to you at your visit today.  If you use inhalers (even only as needed), please bring them with you on the day of your procedure.  If you take any of the following medications, they will need to be adjusted prior to your procedure:   DO NOT TAKE 7 DAYS PRIOR TO TEST- Trulicity (dulaglutide) Ozempic, Wegovy (semaglutide) Mounjaro (tirzepatide) Bydureon Bcise (exanatide extended release)  DO NOT TAKE 1 DAY PRIOR TO YOUR TEST Rybelsus (semaglutide) Adlyxin (lixisenatide) Victoza (liraglutide) Byetta (exanatide) ___________________________________________________________________________    I appreciate the opportunity to care for  you. Hyacinth Meeker, PA-C

## 2023-03-22 NOTE — Progress Notes (Signed)
Noted  

## 2023-03-22 NOTE — Progress Notes (Signed)
Chief Complaint: History of HPV and HIV  HPI:    [name] is a 42 y/o Caucasian male with a past medical history as listed below including HIV, asthma and multiple others, known to Dr. Marina Goodell, who was referred to me by [name], DO for his history of HPV and HIV.    01/30/2017 colonoscopy done for left lower quadrant abdominal pain, rectal bleeding and a change in bowel habits with internal hemorrhoids and suspected IBS.  Repeat recommended in 10 years.  Told to start Metamucil 2 tablespoons daily.    01/24/2023 patient saw infectious disease in regards to HIV disease.  Doing well and tolerating ART and Bactrim.  Noted that he was on Ozempic.  At that time his doctor recommended an EGD given history of HPV.    Today, the patient presents to clinic and tells me he was told to come here because of his history of HPV and HIV and the increased risk for esophageal/stomach cancer.  He denies any overt GI symptoms.  In fact he feels quite well.  He does take occasional fiber for some stool regularity but does not use this very often.  Denies any abdominal pain, heartburn or reflux.    Denies fever, chills, weight loss or blood in his stool.  Past Medical History:  Diagnosis Date   Asthma    had asthma as a child   Diabetes mellitus without complication (HCC)    Enlarged prostate    HIV disease (HCC)    Hypertension    hx of    Inguinal hernia bilateral, non-recurrent    Obesity     Past Surgical History:  Procedure Laterality Date   dental extractions     wisdom teeth   HERNIA REPAIR     INGUINAL HERNIA REPAIR  07/04/2012   Procedure: LAPAROSCOPIC BILATERAL INGUINAL HERNIA REPAIR;  Surgeon: Axel Filler, MD;  Location: [facility] OR;  Service: General;  Laterality: Bilateral;   INSERTION OF MESH  07/04/2012   Procedure: INSERTION OF MESH;  Surgeon: Axel Filler, MD;  Location: [facility] OR;  Service: General;  Laterality: Bilateral;    Current Outpatient Medications  Medication Sig  Dispense Refill   amLODipine (NORVASC) 10 MG tablet Take 1 tablet (10 mg total) by mouth daily. 90 tablet 3   azelastine (ASTELIN) 0.1 % nasal spray Place 1 spray into both nostrils 2 (two) times daily. (Patient not taking: Reported on 03/14/2023) 30 mL 12   bictegravir-emtricitabine-tenofovir AF (BIKTARVY) 50-200-25 MG TABS tablet Take 1 tablet by mouth daily. Try to take at the same time each day with or without food. 30 tablet 5   hydrochlorothiazide (HYDRODIURIL) 25 MG tablet Take 1 tablet (25 mg total) by mouth daily. 90 tablet 3   levocetirizine (XYZAL) 5 MG tablet Take 1 tablet (5 mg total) by mouth at bedtime as needed for allergies (drainage). 90 tablet 3   ondansetron (ZOFRAN-ODT) 4 MG disintegrating tablet Take 1 tablet (4 mg total) by mouth every 8 (eight) hours as needed for nausea or vomiting. 20 tablet 0   Semaglutide, 1 MG/DOSE, 4 MG/3ML SOPN Inject 1 mg as directed every 7 (seven) days. 9 mL 3   sulfamethoxazole-trimethoprim (BACTRIM DS) 800-160 MG tablet Take 1 tablet by mouth daily. 30 tablet 5   No current facility-administered medications for this visit.    Allergies as of 03/22/2023   (No Known Allergies)    Family History  Problem Relation Age of Onset   Diabetes Maternal Grandmother  Heart disease Maternal Grandfather        heart attack   Diabetes Maternal Grandfather    Prostate cancer Paternal Grandfather     Social History   Socioeconomic History   Marital status: Single    Spouse name: Not on file   Number of children: 0   Years of education: Not on file   Highest education level: Not on file  Occupational History   Occupation: truck driver  Tobacco Use   Smoking status: Never    Passive exposure: Past   Smokeless tobacco: Never  Vaping Use   Vaping status: Never Used  Substance and Sexual Activity   Alcohol use: No   Drug use: No   Sexual activity: Not Currently    Partners: Male    Comment: 1 year  Other Topics Concern   Not on file   Social History Narrative   Not on file   Social Determinants of Health   Financial Resource Strain: Not on file  Food Insecurity: Not on file  Transportation Needs: Not on file  Physical Activity: Not on file  Stress: Not on file  Social Connections: Not on file  Intimate Partner Violence: Not on file    Review of Systems:    Constitutional: No weight loss, fever or chills Skin: No rash  Cardiovascular: No chest pain  Respiratory: No SOB Gastrointestinal: See HPI and otherwise negative Genitourinary: No dysuria  Neurological: No headache, dizziness or syncope Musculoskeletal: No new muscle or joint pain Hematologic: No bleeding Psychiatric: No history of depression or anxiety   Physical Exam:  Vital signs: BP 118/80   Pulse 88   Ht 5\' 8"  (1.727 m)   Wt 187 lb (84.8 kg)   BMI 28.43 kg/m    Constitutional:   Pleasant overweight Caucasian male appears to be in NAD, Well developed, Well nourished, alert and cooperative Head:  Normocephalic and atraumatic. Eyes:   PEERL, EOMI. No icterus. Conjunctiva pink. Ears:  Normal auditory acuity. Neck:  Supple Throat: Oral cavity and pharynx without inflammation, swelling or lesion.  Respiratory: Respirations even and unlabored. Lungs clear to auscultation bilaterally.   No wheezes, crackles, or rhonchi.  Cardiovascular: Normal S1, S2. No MRG. Regular rate and rhythm. No peripheral edema, cyanosis or pallor.  Gastrointestinal:  Soft, nondistended, nontender. No rebound or guarding. Normal bowel sounds. No appreciable masses or hepatomegaly. Rectal:  Not performed.  Msk:  Symmetrical without gross deformities. Without edema, no deformity or joint abnormality.  Neurologic:  Alert and  oriented x4;  grossly normal neurologically.  Skin:   Dry and intact without significant lesions or rashes. Psychiatric: Demonstrates good judgement and reason without abnormal affect or behaviors.  RELEVANT LABS AND IMAGING: CBC    Component Value  Date/Time   WBC 4.1 01/24/2023 0916   RBC 4.69 01/24/2023 0916   HGB 13.7 01/24/2023 0916   HGB 13.9 02/24/2022 1124   HCT 41.0 01/24/2023 0916   HCT 42.0 02/24/2022 1124   PLT 228 01/24/2023 0916   PLT 293 02/24/2022 1124   MCV 87.4 01/24/2023 0916   MCV 85 02/24/2022 1124   MCH 29.2 01/24/2023 0916   MCHC 33.4 01/24/2023 0916   RDW 14.0 01/24/2023 0916   RDW 12.8 02/24/2022 1124   LYMPHSABS 1,816 01/24/2023 0916   LYMPHSABS 1.7 02/24/2022 1124   MONOABS 0.5 01/11/2010 0129   EOSABS 90 01/24/2023 0916   EOSABS 0.1 02/24/2022 1124   BASOSABS 41 01/24/2023 0916   BASOSABS 0.0 02/24/2022 1124  CMP     Component Value Date/Time   NA 137 01/24/2023 0916   NA 139 10/05/2022 1502   K 3.7 01/24/2023 0916   CL 100 01/24/2023 0916   CO2 28 01/24/2023 0916   GLUCOSE 90 01/24/2023 0916   BUN 13 01/24/2023 0916   BUN 12 10/05/2022 1502   CREATININE 1.06 01/24/2023 0916   CALCIUM 9.7 01/24/2023 0916   PROT 9.6 (H) 01/24/2023 0916   PROT 9.2 (H) 10/05/2022 1502   ALBUMIN 4.2 10/05/2022 1502   AST 73 (H) 01/24/2023 0916   ALT 58 (H) 01/24/2023 0916   ALKPHOS 87 10/05/2022 1502   BILITOT 0.4 01/24/2023 0916   BILITOT 0.4 10/05/2022 1502   GFRNONAA 92 12/03/2019 0850   GFRAA 107 12/03/2019 0850    Assessment: 1.  History of HIV and HPV: Per infectious disease they would like the patient to have an EGD for screening for esophageal/gastric cancer  Plan: 1.  Scheduled patient for diagnostic EGD in the [facility] with Dr. Marina Goodell.  Did provide the patient a detailed list of risks for the procedure and he agrees to proceed. Patient is appropriate for endoscopic procedure(s) in the ambulatory ([facility]) setting.  2.  Patient to follow in clinic per recommendations after time of EGD above.  Hyacinth Meeker, PA-C [facility] 03/22/2023, 8:42 AM  Cc: [name], DO

## 2023-03-24 ENCOUNTER — Ambulatory Visit: Payer: BC Managed Care – PPO | Admitting: Family Medicine

## 2023-03-24 ENCOUNTER — Encounter: Payer: Self-pay | Admitting: Family Medicine

## 2023-03-24 VITALS — BP 114/71 | HR 74 | Temp 98.0°F | Ht 68.0 in | Wt 188.2 lb

## 2023-03-24 DIAGNOSIS — E1159 Type 2 diabetes mellitus with other circulatory complications: Secondary | ICD-10-CM | POA: Diagnosis not present

## 2023-03-24 DIAGNOSIS — Z7985 Long-term (current) use of injectable non-insulin antidiabetic drugs: Secondary | ICD-10-CM | POA: Diagnosis not present

## 2023-03-24 DIAGNOSIS — K760 Fatty (change of) liver, not elsewhere classified: Secondary | ICD-10-CM

## 2023-03-24 DIAGNOSIS — E785 Hyperlipidemia, unspecified: Secondary | ICD-10-CM | POA: Diagnosis not present

## 2023-03-24 DIAGNOSIS — I152 Hypertension secondary to endocrine disorders: Secondary | ICD-10-CM

## 2023-03-24 DIAGNOSIS — E1169 Type 2 diabetes mellitus with other specified complication: Secondary | ICD-10-CM

## 2023-03-24 LAB — BAYER DCA HB A1C WAIVED: HB A1C (BAYER DCA - WAIVED): 5.1 % (ref 4.8–5.6)

## 2023-03-24 MED ORDER — SEMAGLUTIDE (1 MG/DOSE) 4 MG/3ML ~~LOC~~ SOPN
2.0000 mg | PEN_INJECTOR | SUBCUTANEOUS | Status: DC
Start: 2023-03-24 — End: 2023-04-11

## 2023-03-24 NOTE — Progress Notes (Signed)
Subjective: CC:DM PCP: [name], DO EXB:MWUXL D Mark Maddox is a 42 y.o. male presenting to clinic today for:  1. Type 2 Diabetes with hypertension, hyperlipidemia:  Reports compliance with Ozempic 1 mg weekly, Norvasc 10 mg daily.  He is not treated for cholesterol currently.  Diabetes Health Maintenance Due  Topic Date Due   FOOT EXAM  Never done   OPHTHALMOLOGY EXAM  Never done   HEMOGLOBIN A1C  07/07/2023    Last A1c:  Lab Results  Component Value Date   HGBA1C 5.6 01/04/2023    ROS: Denies chest pain, shortness of breath, dizziness, visual disturbance.  URI symptoms cleared after masking when he mowed  ROS: Per HPI  No Known Allergies Past Medical History:  Diagnosis Date   Asthma    had asthma as a child   Diabetes mellitus without complication (HCC)    Enlarged prostate    HIV disease (HCC)    Hypertension    hx of    Inguinal hernia bilateral, non-recurrent    Obesity     Current Outpatient Medications:    amLODipine (NORVASC) 10 MG tablet, Take 1 tablet (10 mg total) by mouth daily., Disp: 90 tablet, Rfl: 3   azelastine (ASTELIN) 0.1 % nasal spray, Place 1 spray into both nostrils 2 (two) times daily., Disp: 30 mL, Rfl: 12   bictegravir-emtricitabine-tenofovir AF (BIKTARVY) 50-200-25 MG TABS tablet, Take 1 tablet by mouth daily. Try to take at the same time each day with or without food., Disp: 30 tablet, Rfl: 5   hydrochlorothiazide (HYDRODIURIL) 25 MG tablet, Take 1 tablet (25 mg total) by mouth daily., Disp: 90 tablet, Rfl: 3   levocetirizine (XYZAL) 5 MG tablet, Take 1 tablet (5 mg total) by mouth at bedtime as needed for allergies (drainage)., Disp: 90 tablet, Rfl: 3   ondansetron (ZOFRAN-ODT) 4 MG disintegrating tablet, Take 1 tablet (4 mg total) by mouth every 8 (eight) hours as needed for nausea or vomiting., Disp: 20 tablet, Rfl: 0   Semaglutide, 1 MG/DOSE, 4 MG/3ML SOPN, Inject 1 mg as directed every 7 (seven) days., Disp: 9 mL, Rfl:  3 Social History   Socioeconomic History   Marital status: Single    Spouse name: Not on file   Number of children: 0   Years of education: Not on file   Highest education level: Not on file  Occupational History   Occupation: truck driver  Tobacco Use   Smoking status: Never    Passive exposure: Past   Smokeless tobacco: Never  Vaping Use   Vaping status: Never Used  Substance and Sexual Activity   Alcohol use: No   Drug use: No   Sexual activity: Not Currently    Partners: Male    Comment: 1 year  Other Topics Concern   Not on file  Social History Narrative   Not on file   Social Determinants of Health   Financial Resource Strain: Not on file  Food Insecurity: Not on file  Transportation Needs: Not on file  Physical Activity: Not on file  Stress: Not on file  Social Connections: Not on file  Intimate Partner Violence: Not on file   Family History  Problem Relation Age of Onset   Diabetes Maternal Grandmother    Heart disease Maternal Grandfather        heart attack   Diabetes Maternal Grandfather    Prostate cancer Paternal Grandfather    Liver cancer Neg Hx    Colon cancer Neg  Hx    Esophageal cancer Neg Hx     Objective: Office vital signs reviewed. BP 114/71   Pulse 74   Temp 98 F (36.7 C) (Oral)   Ht 5\' 8"  (1.727 m)   Wt 188 lb 3.2 oz (85.4 kg)   SpO2 97%   BMI 28.62 kg/m   Physical Examination:  General: Awake, alert, well nourished, No acute distress HEENT:  sclera white, MMM Cardio: regular rate and rhythm, S1S2 heard, no murmurs appreciated Pulm: clear to auscultation bilaterally, no wheezes, rhonchi or rales; normal work of breathing on room air   Diabetic Foot Exam - Simple   Simple Foot Form Diabetic Foot exam was performed with the following findings: Yes 03/24/2023  1:44 PM  Visual Inspection No deformities, no ulcerations, no other skin breakdown bilaterally: Yes Sensation Testing Intact to touch and monofilament testing  bilaterally: Yes Pulse Check Posterior Tibialis and Dorsalis pulse intact bilaterally: Yes Comments     Assessment/ Plan: 42 y.o. male   Controlled type 2 diabetes mellitus with other specified complication, without long-term current use of insulin (HCC) - Plan: Microalbumin / creatinine urine ratio, Bayer DCA Hb A1c Waived, Semaglutide, 1 MG/DOSE, 4 MG/3ML SOPN  Hyperlipidemia associated with type 2 diabetes mellitus (HCC) - Plan: Hepatic function panel  NAFLD (nonalcoholic fatty liver disease) - Plan: Hepatic function panel  Hypertension associated with diabetes (HCC)  Long-term current use of injectable noninsulin antidiabetic medication  Sugar remains under excellent control.  No changes.  We will try and get him set up with diabetic eye exam here.  Urine microalbumin collected today.  He may trial going up to 2 mg of Ozempic weekly and if this is more efficacious in helping him meet clinical goals then I will be glad to transition him to the 2 mg.  Otherwise, he will reach out to me in the next 3 to 4 weeks and we can consider transition over to Mounjaro 7.5 mg weekly.  His goal is to lose more weight so that he can start reversing that nonalcoholic fatty liver disease that was observed previously.  Continue to follow-up as directed for routine liver function test, particularly in the setting of treatment for HIV.  HTN well controlled. No changes needed.  No orders of the defined types were placed in this encounter.  No orders of the defined types were placed in this encounter.    [name], DO [facility] [phone]

## 2023-04-06 ENCOUNTER — Other Ambulatory Visit (HOSPITAL_COMMUNITY): Payer: Self-pay

## 2023-04-11 ENCOUNTER — Encounter: Payer: Self-pay | Admitting: Internal Medicine

## 2023-04-11 ENCOUNTER — Encounter: Payer: Self-pay | Admitting: Family Medicine

## 2023-04-11 DIAGNOSIS — E1169 Type 2 diabetes mellitus with other specified complication: Secondary | ICD-10-CM

## 2023-04-11 DIAGNOSIS — K760 Fatty (change of) liver, not elsewhere classified: Secondary | ICD-10-CM

## 2023-04-11 MED ORDER — TIRZEPATIDE 7.5 MG/0.5ML ~~LOC~~ SOAJ: 7.5000 mg | SUBCUTANEOUS | 0 refills | Status: DC

## 2023-04-12 ENCOUNTER — Other Ambulatory Visit (HOSPITAL_COMMUNITY): Payer: Self-pay

## 2023-04-14 ENCOUNTER — Other Ambulatory Visit (HOSPITAL_COMMUNITY): Payer: Self-pay

## 2023-04-18 ENCOUNTER — Ambulatory Visit (INDEPENDENT_AMBULATORY_CARE_PROVIDER_SITE_OTHER): Payer: BC Managed Care – PPO

## 2023-04-18 DIAGNOSIS — E1169 Type 2 diabetes mellitus with other specified complication: Secondary | ICD-10-CM | POA: Diagnosis not present

## 2023-04-18 LAB — HM DIABETES EYE EXAM

## 2023-04-18 NOTE — Progress Notes (Signed)
Mark Maddox arrived 04/18/2023 and has given verbal consent to obtain images and complete their overdue diabetic retinal screening.  The images have been sent to an ophthalmologist or optometrist for review and interpretation.  Results will be sent back to [name], DO for review.  Patient has been informed they will be contacted when we receive the results via telephone or MyChart

## 2023-04-19 ENCOUNTER — Encounter: Payer: Self-pay | Admitting: Internal Medicine

## 2023-04-19 ENCOUNTER — Ambulatory Visit (AMBULATORY_SURGERY_CENTER): Payer: BC Managed Care – PPO | Admitting: Internal Medicine

## 2023-04-19 VITALS — BP 119/73 | HR 75 | Temp 98.9°F | Resp 16 | Ht 68.0 in | Wt 187.0 lb

## 2023-04-19 DIAGNOSIS — B977 Papillomavirus as the cause of diseases classified elsewhere: Secondary | ICD-10-CM | POA: Diagnosis not present

## 2023-04-19 DIAGNOSIS — R1030 Lower abdominal pain, unspecified: Secondary | ICD-10-CM

## 2023-04-19 DIAGNOSIS — B20 Human immunodeficiency virus [HIV] disease: Secondary | ICD-10-CM

## 2023-04-19 MED ORDER — SODIUM CHLORIDE 0.9 % IV SOLN: 500.0000 mL | Freq: Once | INTRAVENOUS | Status: DC

## 2023-04-19 NOTE — Progress Notes (Signed)
Vss nad trans to pacu 

## 2023-04-19 NOTE — Patient Instructions (Signed)
   Normal exam  Resume usual diet & medications     YOU HAD AN ENDOSCOPIC PROCEDURE TODAY AT THE [facility]:   Refer to the procedure report that was given to you for any specific questions about what was found during the examination.  If the procedure report does not answer your questions, please call your gastroenterologist to clarify.  If you requested that your care partner not be given the details of your procedure findings, then the procedure report has been included in a sealed envelope for you to review at your convenience later.  YOU SHOULD EXPECT: Some feelings of bloating in the abdomen. Passage of more gas than usual.  Walking can help get rid of the air that was put into your GI tract during the procedure and reduce the bloating. If you had a lower endoscopy (such as a colonoscopy or flexible sigmoidoscopy) you may notice spotting of blood in your stool or on the toilet paper. If you underwent a bowel prep for your procedure, you may not have a normal bowel movement for a few days.  Please Note:  You might notice some irritation and congestion in your nose or some drainage.  This is from the oxygen used during your procedure.  There is no need for concern and it should clear up in a day or so.  SYMPTOMS TO REPORT IMMEDIATELY:   Following upper endoscopy (EGD)  Vomiting of blood or coffee ground material  New chest pain or pain under the shoulder blades  Painful or persistently difficult swallowing  New shortness of breath  Fever of 100F or higher  Black, tarry-looking stools  For urgent or emergent issues, a gastroenterologist can be reached at any hour by calling [phone]. Do not use MyChart messaging for urgent concerns.    DIET:  We do recommend a small meal at first, but then you may proceed to your regular diet.  Drink plenty of fluids but you should avoid alcoholic beverages for 24 hours.  ACTIVITY:  You should plan to take it easy for the rest of  today and you should NOT DRIVE or use heavy machinery until tomorrow (because of the sedation medicines used during the test).    FOLLOW UP: Our staff will call the number listed on your records the next business day following your procedure.  We will call around 7:15- 8:00 am to check on you and address any questions or concerns that you may have regarding the information given to you following your procedure. If we do not reach you, we will leave a message.     If any biopsies were taken you will be contacted by phone or by letter within the next 1-3 weeks.  Please call us at [phone] if you have not heard about the biopsies in 3 weeks.    SIGNATURES/CONFIDENTIALITY: You and/or your care partner have signed paperwork which will be entered into your electronic medical record.  These signatures attest to the fact that that the information above on your After Visit Summary has been reviewed and is understood.  Full responsibility of the confidentiality of this discharge information lies with you and/or your care-partner.

## 2023-04-19 NOTE — Op Note (Signed)
[facility] Patient Name: Mark Maddox Procedure Date: 04/19/2023 11:27 AM MRN: [id number] Endoscopist: Wilhemina Bonito. Marina Goodell , MD, [id number] Age: 42 Referring MD:  Date of Birth: [birth year] Gender: Male Account #: [id number] Procedure:                Upper GI endoscopy Indications:              Abdominal pain, hx HIV and HPV. Sent by ID                            physician to screen for UGI cancer Medicines:                Monitored Anesthesia Care Procedure:                Pre-Anesthesia Assessment:                           - Prior to the procedure, a History and Physical                            was performed, and patient medications and                            allergies were reviewed. The patient's tolerance of                            previous anesthesia was also reviewed. The risks                            and benefits of the procedure and the sedation                            options and risks were discussed with the patient.                            All questions were answered, and informed consent                            was obtained. Prior Anticoagulants: The patient has                            taken no anticoagulant or antiplatelet agents. ASA                            Grade Assessment: II - A patient with mild systemic                            disease. After reviewing the risks and benefits,                            the patient was deemed in satisfactory condition to                            undergo the procedure.  After obtaining informed consent, the endoscope was                            passed under direct vision. Throughout the                            procedure, the patient's blood pressure, pulse, and                            oxygen saturations were monitored continuously. The                            Olympus Scope SN [id number] was introduced through the                            mouth, and advanced to  the second part of duodenum.                            The upper GI endoscopy was accomplished without                            difficulty. The patient tolerated the procedure                            well. Scope In: Scope Out: Findings:                 The esophagus was normal.                           The stomach was normal.                           The examined duodenum was normal.                           The cardia and gastric fundus were normal on                            retroflexion. Complications:            No immediate complications. Estimated Blood Loss:     Estimated blood loss: none. Impression:               - Normal esophagus.                           - Normal stomach.                           - Normal examined duodenum.                           - No specimens collected. Recommendation:           - Patient has a contact number available for  emergencies. The signs and symptoms of potential                            delayed complications were discussed with the                            patient. Return to normal activities tomorrow.                            Written discharge instructions were provided to the                            patient.                           - Resume previous diet.                           - Continue present medications. Wilhemina Bonito. Marina Goodell, MD 04/19/2023 11:40:08 AM This report has been signed electronically.

## 2023-04-19 NOTE — Progress Notes (Signed)
Expand All Collapse All    Chief Complaint: History of HPV and HIV   HPI:    Mark Maddox is a 42 y/o Caucasian male with a past medical history as listed below including HIV, asthma and multiple others, known to Dr. Marina Goodell, who was referred to me by [name], DO for his history of HPV and HIV.    01/30/2017 colonoscopy done for left lower quadrant abdominal pain, rectal bleeding and a change in bowel habits with internal hemorrhoids and suspected IBS.  Repeat recommended in 10 years.  Told to start Metamucil 2 tablespoons daily.    01/24/2023 patient saw infectious disease in regards to HIV disease.  Doing well and tolerating ART and Bactrim.  Noted that he was on Ozempic.  At that time his doctor recommended an EGD given history of HPV.    Today, the patient presents to clinic and tells me he was told to come here because of his history of HPV and HIV and the increased risk for esophageal/stomach cancer.  He denies any overt GI symptoms.  In fact he feels quite well.  He does take occasional fiber for some stool regularity but does not use this very often.  Denies any abdominal pain, heartburn or reflux.    Denies fever, chills, weight loss or blood in his stool.       Past Medical History:  Diagnosis Date   Asthma      had asthma as a child   Diabetes mellitus without complication (HCC)     Enlarged prostate     HIV disease (HCC)     Hypertension      hx of    Inguinal hernia bilateral, non-recurrent     Obesity                 Past Surgical History:  Procedure Laterality Date   dental extractions        wisdom teeth   HERNIA REPAIR       INGUINAL HERNIA REPAIR   07/04/2012    Procedure: LAPAROSCOPIC BILATERAL INGUINAL HERNIA REPAIR;  Surgeon: Axel Filler, MD;  Location: [facility] OR;  Service: General;  Laterality: Bilateral;   INSERTION OF MESH   07/04/2012    Procedure: INSERTION OF MESH;  Surgeon: Axel Filler, MD;  Location: [facility] OR;  Service: General;  Laterality:  Bilateral;                Current Outpatient Medications  Medication Sig Dispense Refill   amLODipine (NORVASC) 10 MG tablet Take 1 tablet (10 mg total) by mouth daily. 90 tablet 3   azelastine (ASTELIN) 0.1 % nasal spray Place 1 spray into both nostrils 2 (two) times daily. (Patient not taking: Reported on 03/14/2023) 30 mL 12   bictegravir-emtricitabine-tenofovir AF (BIKTARVY) 50-200-25 MG TABS tablet Take 1 tablet by mouth daily. Try to take at the same time each day with or without food. 30 tablet 5   hydrochlorothiazide (HYDRODIURIL) 25 MG tablet Take 1 tablet (25 mg total) by mouth daily. 90 tablet 3   levocetirizine (XYZAL) 5 MG tablet Take 1 tablet (5 mg total) by mouth at bedtime as needed for allergies (drainage). 90 tablet 3   ondansetron (ZOFRAN-ODT) 4 MG disintegrating tablet Take 1 tablet (4 mg total) by mouth every 8 (eight) hours as needed for nausea or vomiting. 20 tablet 0   Semaglutide, 1 MG/DOSE, 4 MG/3ML SOPN Inject 1 mg as directed every 7 (seven) days. 9 mL 3  sulfamethoxazole-trimethoprim (BACTRIM DS) 800-160 MG tablet Take 1 tablet by mouth daily. 30 tablet 5      No current facility-administered medications for this visit.           Allergies as of 03/22/2023   (No Known Allergies)           Family History  Problem Relation Age of Onset   Diabetes Maternal Grandmother     Heart disease Maternal Grandfather          heart attack   Diabetes Maternal Grandfather     Prostate cancer Paternal Grandfather            Social History         Socioeconomic History   Marital status: Single      Spouse name: Not on file   Number of children: 0   Years of education: Not on file   Highest education level: Not on file  Occupational History   Occupation: truck driver  Tobacco Use   Smoking status: Never      Passive exposure: Past   Smokeless tobacco: Never  Vaping Use   Vaping status: Never Used  Substance and Sexual Activity   Alcohol use: No    Drug use: No   Sexual activity: Not Currently      Partners: Male      Comment: 1 year  Other Topics Concern   Not on file  Social History Narrative   Not on file    Social Determinants of Health    Financial Resource Strain: Not on file  Food Insecurity: Not on file  Transportation Needs: Not on file  Physical Activity: Not on file  Stress: Not on file  Social Connections: Not on file  Intimate Partner Violence: Not on file      Review of Systems:    Constitutional: No weight loss, fever or chills Skin: No rash  Cardiovascular: No chest pain  Respiratory: No SOB Gastrointestinal: See HPI and otherwise negative Genitourinary: No dysuria  Neurological: No headache, dizziness or syncope Musculoskeletal: No new muscle or joint pain Hematologic: No bleeding Psychiatric: No history of depression or anxiety    Physical Exam:  Vital signs: BP 118/80   Pulse 88   Ht 5\' 8"  (1.727 m)   Wt 187 lb (84.8 kg)   BMI 28.43 kg/m     Constitutional:   Pleasant overweight Caucasian male appears to be in NAD, Well developed, Well nourished, alert and cooperative Head:  Normocephalic and atraumatic. Eyes:   PEERL, EOMI. No icterus. Conjunctiva pink. Ears:  Normal auditory acuity. Neck:  Supple Throat: Oral cavity and pharynx without inflammation, swelling or lesion.  Respiratory: Respirations even and unlabored. Lungs clear to auscultation bilaterally.   No wheezes, crackles, or rhonchi.  Cardiovascular: Normal S1, S2. No MRG. Regular rate and rhythm. No peripheral edema, cyanosis or pallor.  Gastrointestinal:  Soft, nondistended, nontender. No rebound or guarding. Normal bowel sounds. No appreciable masses or hepatomegaly. Rectal:  Not performed.  Msk:  Symmetrical without gross deformities. Without edema, no deformity or joint abnormality.  Neurologic:  Alert and  oriented x4;  grossly normal neurologically.  Skin:   Dry and intact without significant lesions or  rashes. Psychiatric: Demonstrates good judgement and reason without abnormal affect or behaviors.   RELEVANT LABS AND IMAGING: CBC Labs (Brief)          Component Value Date/Time    WBC 4.1 01/24/2023 0916    RBC 4.69 01/24/2023 0916  HGB 13.7 01/24/2023 0916    HGB 13.9 02/24/2022 1124    HCT 41.0 01/24/2023 0916    HCT 42.0 02/24/2022 1124    PLT 228 01/24/2023 0916    PLT 293 02/24/2022 1124    MCV 87.4 01/24/2023 0916    MCV 85 02/24/2022 1124    MCH 29.2 01/24/2023 0916    MCHC 33.4 01/24/2023 0916    RDW 14.0 01/24/2023 0916    RDW 12.8 02/24/2022 1124    LYMPHSABS 1,816 01/24/2023 0916    LYMPHSABS 1.7 02/24/2022 1124    MONOABS 0.5 01/11/2010 0129    EOSABS 90 01/24/2023 0916    EOSABS 0.1 02/24/2022 1124    BASOSABS 41 01/24/2023 0916    BASOSABS 0.0 02/24/2022 1124        CMP     Labs (Brief)          Component Value Date/Time    NA 137 01/24/2023 0916    NA 139 10/05/2022 1502    K 3.7 01/24/2023 0916    CL 100 01/24/2023 0916    CO2 28 01/24/2023 0916    GLUCOSE 90 01/24/2023 0916    BUN 13 01/24/2023 0916    BUN 12 10/05/2022 1502    CREATININE 1.06 01/24/2023 0916    CALCIUM 9.7 01/24/2023 0916    PROT 9.6 (H) 01/24/2023 0916    PROT 9.2 (H) 10/05/2022 1502    ALBUMIN 4.2 10/05/2022 1502    AST 73 (H) 01/24/2023 0916    ALT 58 (H) 01/24/2023 0916    ALKPHOS 87 10/05/2022 1502    BILITOT 0.4 01/24/2023 0916    BILITOT 0.4 10/05/2022 1502    GFRNONAA 92 12/03/2019 0850    GFRAA 107 12/03/2019 0850        Assessment: 1.  History of HIV and HPV: Per infectious disease they would like the patient to have an EGD for screening for esophageal/gastric cancer   Plan: 1.  Scheduled patient for diagnostic EGD in the [facility] with Dr. Marina Goodell.  Did provide the patient a detailed list of risks for the procedure and he agrees to proceed. Patient is appropriate for endoscopic procedure(s) in the ambulatory ([facility]) setting.  2.  Patient to follow in clinic per  recommendations after time of EGD above.   Hyacinth Meeker, PA-C [facility] 03/22/2023, 8:42 AM

## 2023-04-20 ENCOUNTER — Telehealth: Payer: Self-pay | Admitting: *Deleted

## 2023-04-20 NOTE — Telephone Encounter (Signed)
No answer on  follow up call. Left message.   

## 2023-04-26 ENCOUNTER — Encounter: Payer: Self-pay | Admitting: Internal Medicine

## 2023-04-26 ENCOUNTER — Other Ambulatory Visit: Payer: Self-pay

## 2023-04-26 ENCOUNTER — Ambulatory Visit: Payer: BC Managed Care – PPO | Admitting: Internal Medicine

## 2023-04-26 VITALS — BP 126/77 | HR 81 | Temp 98.1°F | Wt 189.0 lb

## 2023-04-26 DIAGNOSIS — B20 Human immunodeficiency virus [HIV] disease: Secondary | ICD-10-CM

## 2023-04-26 DIAGNOSIS — Z23 Encounter for immunization: Secondary | ICD-10-CM | POA: Diagnosis not present

## 2023-04-26 MED ORDER — SULFAMETHOXAZOLE-TRIMETHOPRIM 800-160 MG PO TABS: 1.0000 | ORAL_TABLET | Freq: Two times a day (BID) | ORAL | 5 refills | Status: DC

## 2023-04-26 NOTE — Progress Notes (Signed)
[facility]     HPI: Mark Maddox is a 42 y.o. male  male presents for HIV management. Newly Cx at helath department, HIV 1+, on 10/16/22. 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 Pt works at [employer]. Pt has not been sexually active since July, 2023. Ex was drug addict. Pt has been tested for HIV in 2012 in negative  Today : biktarvy everyday. Bactrim ran out 2 months ago Today: Doing well tolerating ART and bactrim. Told his family in regards to diagnosis. Does not wish to seek counseling.  PMHx: DM now ozempic Date of diagnosis: 2/25/'24 ART exposure: no Past Ois: no Risk factors: MSM,  Partners in last 2months 0, in the last 12 months 1.  Anal sex receptive y, insertivey . Contraception sometimes Oral sex, contraception no Vaginal penile sex, contraception no   Social: non drinker/does not smoke ciggerettes Occupation: Occupational hygienist Housing: leaves in house/mom Support: Family aware of diagnosis Understanding of HIV: fair     Past Medical History:  Diagnosis Date   Asthma    had asthma as a child   Diabetes mellitus without complication (HCC)    Enlarged prostate    HIV disease (HCC)    Hypertension    hx of    Inguinal hernia bilateral, non-recurrent    Obesity     Past Surgical History:  Procedure Laterality Date   dental extractions     wisdom teeth   HERNIA REPAIR     INGUINAL HERNIA REPAIR  07/04/2012   Procedure: LAPAROSCOPIC BILATERAL INGUINAL HERNIA REPAIR;  Surgeon: Axel Filler, MD;  Location: [facility] OR;  Service: General;  Laterality: Bilateral;   INSERTION OF MESH  07/04/2012   Procedure: INSERTION OF MESH;  Surgeon: Axel Filler, MD;  Location: [facility] OR;  Service: General;  Laterality: Bilateral;    Family History  Problem Relation Age of Onset   Diabetes Maternal Grandmother    Heart disease Maternal Grandfather        heart attack   Diabetes Maternal Grandfather     Prostate cancer Paternal Grandfather    Liver cancer Neg Hx    Colon cancer Neg Hx    Esophageal cancer Neg Hx    Current Outpatient Medications on File Prior to Visit  Medication Sig Dispense Refill   amLODipine (NORVASC) 10 MG tablet Take 1 tablet (10 mg total) by mouth daily. 90 tablet 3   azelastine (ASTELIN) 0.1 % nasal spray Place 1 spray into both nostrils 2 (two) times daily. 30 mL 12   bictegravir-emtricitabine-tenofovir AF (BIKTARVY) 50-200-25 MG TABS tablet Take 1 tablet by mouth daily. Try to take at the same time each day with or without food. 30 tablet 5   hydrochlorothiazide (HYDRODIURIL) 25 MG tablet Take 1 tablet (25 mg total) by mouth daily. 90 tablet 3   levocetirizine (XYZAL) 5 MG tablet Take 1 tablet (5 mg total) by mouth at bedtime as needed for allergies (drainage). 90 tablet 3   ondansetron (ZOFRAN-ODT) 4 MG disintegrating tablet Take 1 tablet (4 mg total) by mouth every 8 (eight) hours as needed for nausea or vomiting. 20 tablet 0   tirzepatide (MOUNJARO) 7.5 MG/0.5ML Pen Inject 7.5 mg into the skin once a week. STOP ozempic 2 mL 0   No current facility-administered medications on file prior to visit.    No Known Allergies    Lab Results HIV 1 RNA  Quant  Date Value  01/24/2023 230 Copies/mL (H)  12/14/2022 5,780 Copies/mL (H)  11/30/2022 485,000 copies/mL (H)   CD4 T Cell Abs (/uL)  Date Value  01/24/2023 138 (L)  11/30/2022 64 (L)   No results found for: "HIV1GENOSEQ" Lab Results  Component Value Date   WBC 4.1 01/24/2023   HGB 13.7 01/24/2023   HCT 41.0 01/24/2023   MCV 87.4 01/24/2023   PLT 228 01/24/2023    Lab Results  Component Value Date   CREATININE 1.06 01/24/2023   BUN 13 01/24/2023   NA 137 01/24/2023   K 3.7 01/24/2023   CL 100 01/24/2023   CO2 28 01/24/2023   Lab Results  Component Value Date   ALT 54 (H) 03/24/2023   AST 55 (H) 03/24/2023   ALKPHOS 82 03/24/2023   BILITOT 0.4 03/24/2023    Lab Results  Component Value  Date   CHOL 106 11/30/2022   TRIG 104 11/30/2022   HDL 29 (L) 11/30/2022   LDLCALC 58 11/30/2022   Lab Results  Component Value Date   HAV REACTIVE (A) 11/30/2022   Lab Results  Component Value Date   HEPBSAG NON-REACTIVE 11/30/2022   HEPBSAB NON-REACTIVE 11/30/2022   Lab Results  Component Value Date   HCVAB NEGATIVE 01/20/2017   Lab Results  Component Value Date   CHLAMYDIAWP Negative 11/30/2022   CHLAMYDIAWP Negative 11/30/2022   CHLAMYDIAWP Negative 11/30/2022   N Negative 11/30/2022   N Negative 11/30/2022   N Negative 11/30/2022   No results found for: "GCPROBEAPT" No results found for: "QUANTGOLD"  Assessment/Plan #HIV -CD4 138, VL,230  on 01/2023 --on biktarvya  -bactrim(ran out) reifillled, counseld on adherence -Interested in injecitbles -Labs today, -Follow-up in 3 months        #Vaccination COVID x2, booster 11/30/22 Flu today 04/26/23 Monkeypox not UTD PCV 20 today 6/4/204  Meningitis not UTD HepA immune HEpB 1st dose  12/14/22, 2nd dose today 01/24/23 Tdap 11/24/2017 Shingles     #Dm-A1c 6.7 -Management per primary   #Health maintenance -Quantiferon 11/30/22 nr -RPR4/10/24 NR -HCV 11/30/22 NR -GC urine/oral  11/30/22 negative -Lipid 11/30/22, will get today 04/26/23 The ASCVD Risk score (Arnett DK, et al., 2019) failed to calculate for the following reasons:   The valid total cholesterol range is 130 to 320 mg/dL  -Dysplasia screen  M 7/82/95 ASCUS, HPV positive(high risk). Referred to GI for HRA.needs HRA. EGD done with GI. -Colonoscopy 5 years ago for mucus per rectum, pt reports found internal hemmahroids    Danelle Earthly, MD [facility]

## 2023-04-27 LAB — CBC WITH DIFFERENTIAL/PLATELET
Absolute Monocytes: 180 {cells}/uL — ABNORMAL LOW (ref 200–950)
Basophils Absolute: 39 {cells}/uL (ref 0–200)
Basophils Relative: 1.3 %
Eosinophils Absolute: 69 {cells}/uL (ref 15–500)
Eosinophils Relative: 2.3 %
HCT: 43.6 % (ref 38.5–50.0)
Hemoglobin: 14.5 g/dL (ref 13.2–17.1)
Lymphs Abs: 1341 {cells}/uL (ref 850–3900)
MCH: 29.6 pg (ref 27.0–33.0)
MCHC: 33.3 g/dL (ref 32.0–36.0)
MCV: 89 fL (ref 80.0–100.0)
MPV: 9 fL (ref 7.5–12.5)
Monocytes Relative: 6 %
Neutro Abs: 1371 {cells}/uL — ABNORMAL LOW (ref 1500–7800)
Neutrophils Relative %: 45.7 %
Platelets: 233 10*3/uL (ref 140–400)
RBC: 4.9 10*6/uL (ref 4.20–5.80)
RDW: 11.9 % (ref 11.0–15.0)
Total Lymphocyte: 44.7 %
WBC: 3 10*3/uL — ABNORMAL LOW (ref 3.8–10.8)

## 2023-04-27 LAB — COMPLETE METABOLIC PANEL WITH GFR
AG Ratio: 1 (calc) (ref 1.0–2.5)
ALT: 39 U/L (ref 9–46)
AST: 41 U/L — ABNORMAL HIGH (ref 10–40)
Albumin: 4.3 g/dL (ref 3.6–5.1)
Alkaline phosphatase (APISO): 67 U/L (ref 36–130)
BUN: 13 mg/dL (ref 7–25)
CO2: 29 mmol/L (ref 20–32)
Calcium: 9.6 mg/dL (ref 8.6–10.3)
Chloride: 100 mmol/L (ref 98–110)
Creat: 1.04 mg/dL (ref 0.60–1.29)
Globulin: 4.2 g/dL — ABNORMAL HIGH (ref 1.9–3.7)
Glucose, Bld: 111 mg/dL — ABNORMAL HIGH (ref 65–99)
Potassium: 3.5 mmol/L (ref 3.5–5.3)
Sodium: 139 mmol/L (ref 135–146)
Total Bilirubin: 0.7 mg/dL (ref 0.2–1.2)
Total Protein: 8.5 g/dL — ABNORMAL HIGH (ref 6.1–8.1)
eGFR: 91 mL/min/{1.73_m2} (ref >=60)

## 2023-04-27 LAB — LIPID PANEL
Cholesterol: 129 mg/dL (ref <200)
HDL: 37 mg/dL — ABNORMAL LOW (ref >=40)
LDL Cholesterol (Calc): 72 mg/dL
Non-HDL Cholesterol (Calc): 92 mg/dL (ref <130)
Total CHOL/HDL Ratio: 3.5 (calc) (ref <5.0)
Triglycerides: 117 mg/dL (ref <150)

## 2023-04-27 LAB — HIV-1 RNA QUANT-NO REFLEX-BLD
HIV 1 RNA Quant: 179 {copies}/mL — ABNORMAL HIGH
HIV-1 RNA Quant, Log: 2.25 {Log_copies}/mL — ABNORMAL HIGH

## 2023-04-27 LAB — T-HELPER CELLS (CD4) COUNT (NOT AT ARMC)
CD4 % Helper T Cell: 10 % — ABNORMAL LOW (ref 33–65)
CD4 T Cell Abs: 133 /uL — ABNORMAL LOW (ref 400–1790)

## 2023-05-09 ENCOUNTER — Other Ambulatory Visit (HOSPITAL_COMMUNITY): Payer: Self-pay

## 2023-05-11 ENCOUNTER — Other Ambulatory Visit: Payer: Self-pay | Admitting: Family Medicine

## 2023-05-11 DIAGNOSIS — E1169 Type 2 diabetes mellitus with other specified complication: Secondary | ICD-10-CM

## 2023-05-11 DIAGNOSIS — K760 Fatty (change of) liver, not elsewhere classified: Secondary | ICD-10-CM

## 2023-06-07 ENCOUNTER — Other Ambulatory Visit: Payer: Self-pay | Admitting: Internal Medicine

## 2023-06-07 ENCOUNTER — Other Ambulatory Visit: Payer: Self-pay

## 2023-06-07 ENCOUNTER — Other Ambulatory Visit (HOSPITAL_COMMUNITY): Payer: Self-pay

## 2023-06-07 DIAGNOSIS — B20 Human immunodeficiency virus [HIV] disease: Secondary | ICD-10-CM

## 2023-06-07 MED ORDER — BIKTARVY 50-200-25 MG PO TABS
1.0000 | ORAL_TABLET | Freq: Every day | ORAL | 1 refills | Status: DC
  Filled 2023-06-07: qty 30, 30d supply, fill #0
  Filled 2023-07-04: qty 30, 30d supply, fill #1

## 2023-06-07 NOTE — Progress Notes (Signed)
Specialty Pharmacy Refill Coordination Note  Mark Maddox is a 42 y.o. male contacted today regarding refills of specialty medication(s) Bictegravir-Emtricitab-Tenofov   Patient requested Delivery   Delivery date: 06/13/23   Verified address: [address]   [place] [address]   Medication will be filled on 06/12/23.

## 2023-06-21 ENCOUNTER — Other Ambulatory Visit: Payer: Self-pay

## 2023-06-21 DIAGNOSIS — R85619 Unspecified abnormal cytological findings in specimens from anus: Secondary | ICD-10-CM

## 2023-06-26 ENCOUNTER — Ambulatory Visit: Payer: BC Managed Care – PPO | Admitting: Nurse Practitioner

## 2023-06-26 ENCOUNTER — Encounter: Payer: Self-pay | Admitting: Nurse Practitioner

## 2023-06-26 VITALS — BP 117/84 | HR 73 | Temp 97.9°F | Resp 20 | Ht 68.0 in | Wt 170.0 lb

## 2023-06-26 DIAGNOSIS — L246 Irritant contact dermatitis due to food in contact with skin: Secondary | ICD-10-CM | POA: Diagnosis not present

## 2023-06-26 MED ORDER — DESOXIMETASONE 0.25 % EX CREA
1.0000 | TOPICAL_CREAM | Freq: Two times a day (BID) | CUTANEOUS | 0 refills | Status: DC
Start: 2023-06-26 — End: 2024-07-02

## 2023-06-26 MED ORDER — METHYLPREDNISOLONE ACETATE 80 MG/ML IJ SUSP
80.0000 mg | Freq: Once | INTRAMUSCULAR | Status: AC
Start: 2023-06-26 — End: 2023-06-26
  Administered 2023-06-26: 80 mg via INTRAMUSCULAR

## 2023-06-26 NOTE — Progress Notes (Signed)
   Subjective:    Patient ID: Mark Maddox, male    DOB: 07-09-1981, 43 y.o.   MRN: [id number]   Chief Complaint: Poison [facility] Pertinent negatives include no eye pain or shortness of breath.    Patient has been doing yard work and got into some poison ivy . Is mainly on lower ext. Patient Active Problem List   Diagnosis Date Noted   Diabetes mellitus type II, controlled (HCC) 01/04/2023   HIV disease (HCC) 01/04/2023   High risk HPV infection 01/04/2023   Seasonal allergic rhinitis due to pollen 10/05/2022   Overweight with body mass index (BMI) 25.0-29.9 11/24/2017   Hypertension associated with diabetes (HCC) 11/24/2017   NAFLD (nonalcoholic fatty liver disease) 30/86/5784   Irritable bowel syndrome 03/27/2017   BPH (benign prostatic hyperplasia) 03/27/2017       Review of Systems  Constitutional:  Negative for diaphoresis.  Eyes:  Negative for pain.  Respiratory:  Negative for shortness of breath.   Cardiovascular:  Negative for chest pain, palpitations and leg swelling.  Gastrointestinal:  Negative for abdominal pain.  Endocrine: Negative for polydipsia.  Skin:  Negative for rash.  Neurological:  Negative for dizziness, weakness and headaches.  Hematological:  Does not bruise/bleed easily.  All other systems reviewed and are negative.      Objective:   Physical Exam Constitutional:      Appearance: Normal appearance.  Cardiovascular:     Rate and Rhythm: Normal rate and regular rhythm.     Heart sounds: Normal heart sounds.  Pulmonary:     Effort: Pulmonary effort is normal.     Breath sounds: Normal breath sounds.  Skin:    General: Skin is warm.     Findings: Rash (erythemaous vesicular lesion in linear pattern on bil lower ext.) present.  Neurological:     General: No focal deficit present.     Mental Status: He is alert and oriented to person, place, and time.  Psychiatric:        Mood and Affect: Mood normal.        Behavior: Behavior  normal.    BP 117/84   Pulse 73   Temp 97.9 F (36.6 C) (Temporal)   Resp 20   Ht 5\' 8"  (1.727 m)   Wt 170 lb (77.1 kg)   SpO2 100%   BMI 25.85 kg/m         Assessment & Plan:   Rayburn Felt in today with chief complaint of Poison Ivy   1. Irritant contact dermatitis due to food in contact with skin Cool compresses Avoid scratching' benadryl OTC RTO prn - methylPREDNISolone acetate (DEPO-MEDROL) injection 80 mg - desoximetasone (TOPICORT) 0.25 % cream; Apply 1 Application topically 2 (two) times daily.  Dispense: 30 g; Refill: 0    The above assessment and management plan was discussed with the patient. The patient verbalized understanding of and has agreed to the management plan. Patient is aware to call the clinic if symptoms persist or worsen. Patient is aware when to return to the clinic for a follow-up visit. Patient educated on when it is appropriate to go to the emergency department.   Mary-Margaret Daphine Deutscher, FNP

## 2023-06-27 ENCOUNTER — Other Ambulatory Visit: Payer: Self-pay | Admitting: Nurse Practitioner

## 2023-06-27 MED ORDER — PREDNISONE 10 MG (21) PO TBPK: ORAL_TABLET | ORAL | 0 refills | Status: DC

## 2023-07-04 ENCOUNTER — Other Ambulatory Visit: Payer: Self-pay

## 2023-07-04 NOTE — Progress Notes (Signed)
Specialty Pharmacy Refill Coordination Note  Mark Maddox is a 42 y.o. male contacted today regarding refills of specialty medication(s) Bictegravir-Emtricitab-Tenofov   Patient requested Delivery   Delivery date: 07/12/23   Verified address: [address]   [place] [address]   Medication will be filled on 07/11/23.

## 2023-07-11 ENCOUNTER — Other Ambulatory Visit: Payer: Self-pay

## 2023-07-28 ENCOUNTER — Other Ambulatory Visit: Payer: Self-pay

## 2023-07-28 ENCOUNTER — Encounter: Payer: Self-pay | Admitting: Family Medicine

## 2023-07-28 DIAGNOSIS — E1169 Type 2 diabetes mellitus with other specified complication: Secondary | ICD-10-CM

## 2023-07-28 DIAGNOSIS — K760 Fatty (change of) liver, not elsewhere classified: Secondary | ICD-10-CM

## 2023-07-31 ENCOUNTER — Ambulatory Visit: Payer: BC Managed Care – PPO | Admitting: Family Medicine

## 2023-07-31 ENCOUNTER — Other Ambulatory Visit: Payer: Self-pay | Admitting: Family Medicine

## 2023-07-31 DIAGNOSIS — E1169 Type 2 diabetes mellitus with other specified complication: Secondary | ICD-10-CM

## 2023-07-31 DIAGNOSIS — K760 Fatty (change of) liver, not elsewhere classified: Secondary | ICD-10-CM

## 2023-08-03 ENCOUNTER — Other Ambulatory Visit (HOSPITAL_COMMUNITY): Payer: Self-pay

## 2023-08-03 ENCOUNTER — Other Ambulatory Visit: Payer: Self-pay

## 2023-08-03 ENCOUNTER — Other Ambulatory Visit: Payer: Self-pay | Admitting: Internal Medicine

## 2023-08-03 DIAGNOSIS — B20 Human immunodeficiency virus [HIV] disease: Secondary | ICD-10-CM

## 2023-08-03 MED ORDER — SULFAMETHOXAZOLE-TRIMETHOPRIM 800-160 MG PO TABS
1.0000 | ORAL_TABLET | Freq: Every day | ORAL | 0 refills | Status: DC
  Filled 2023-08-03: qty 30, 30d supply, fill #0

## 2023-08-03 MED ORDER — BIKTARVY 50-200-25 MG PO TABS
1.0000 | ORAL_TABLET | Freq: Every day | ORAL | 0 refills | Status: DC
  Filled 2023-08-03: qty 30, 30d supply, fill #0

## 2023-08-03 NOTE — Progress Notes (Signed)
Specialty Pharmacy Refill Coordination Note  Mark Maddox is a 42 y.o. male contacted today regarding refills of specialty medication(s) Bictegravir-Emtricitab-Tenofov [name]   Patient requested Delivery   Delivery date: 08/08/23   Verified address: [address] [place] [address]   Medication will be filled on 08/07/23.   *pending refill request-- call if any delays*

## 2023-08-07 ENCOUNTER — Other Ambulatory Visit: Payer: Self-pay

## 2023-08-07 ENCOUNTER — Other Ambulatory Visit (HOSPITAL_COMMUNITY): Payer: Self-pay

## 2023-08-07 ENCOUNTER — Encounter: Payer: Self-pay | Admitting: Internal Medicine

## 2023-08-07 ENCOUNTER — Ambulatory Visit: Payer: BC Managed Care – PPO | Admitting: Internal Medicine

## 2023-08-07 VITALS — BP 143/88 | HR 77 | Temp 98.3°F | Ht 68.0 in | Wt 165.0 lb

## 2023-08-07 DIAGNOSIS — B20 Human immunodeficiency virus [HIV] disease: Secondary | ICD-10-CM

## 2023-08-07 MED ORDER — BIKTARVY 50-200-25 MG PO TABS
1.0000 | ORAL_TABLET | Freq: Every day | ORAL | 5 refills | Status: DC
  Filled 2023-08-07: qty 30, 30d supply, fill #0
  Filled 2023-09-20: qty 30, 30d supply, fill #1
  Filled 2023-10-11: qty 30, 30d supply, fill #2
  Filled 2023-11-13 – 2023-12-04 (×2): qty 30, 30d supply, fill #3
  Filled 2023-12-20: qty 30, 30d supply, fill #4

## 2023-08-07 NOTE — Progress Notes (Signed)
[facility]     HPI: Mark Maddox is a 42 y.o. male  male presents for HIV management. Newly Cx at helath department, HIV 1+, on 10/16/22. 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 Pt works at [employer]. Pt has not been sexually active since July, 2023. Ex was drug addict. Pt has been tested for HIV in 2012 in negative  Today 12/16: NO missed doses of biktarvy. Acouple missed dose of bactrim  PMHx: DM now Monjaro Date of diagnosis: 2/25/'24 ART exposure: no Past Ois: no Risk factors: MSM,  Partners in last 2months 0, in the last 12 months 1.  Anal sex receptive y, insertivey . Contraception sometimes Oral sex, contraception no Vaginal penile sex, contraception no   Social: non drinker/does not smoke ciggerettes Occupation: Occupational hygienist Housing: leaves in house/mom Support: Family aware of diagnosis Understanding of HIV: fair    Past Medical History:  Diagnosis Date   Asthma    had asthma as a child   Diabetes mellitus without complication (HCC)    Enlarged prostate    HIV disease (HCC)    Hypertension    hx of    Inguinal hernia bilateral, non-recurrent    Obesity     Past Surgical History:  Procedure Laterality Date   dental extractions     wisdom teeth   HERNIA REPAIR     INGUINAL HERNIA REPAIR  07/04/2012   Procedure: LAPAROSCOPIC BILATERAL INGUINAL HERNIA REPAIR;  Surgeon: Axel Filler, MD;  Location: [facility] OR;  Service: General;  Laterality: Bilateral;   INSERTION OF MESH  07/04/2012   Procedure: INSERTION OF MESH;  Surgeon: Axel Filler, MD;  Location: [facility] OR;  Service: General;  Laterality: Bilateral;    Family History  Problem Relation Age of Onset   Diabetes Maternal Grandmother    Heart disease Maternal Grandfather        heart attack   Diabetes Maternal Grandfather    Prostate cancer Paternal Grandfather    Liver cancer Neg Hx    Colon cancer Neg Hx    Esophageal  cancer Neg Hx    Current Outpatient Medications on File Prior to Visit  Medication Sig Dispense Refill   amLODipine (NORVASC) 10 MG tablet Take 1 tablet (10 mg total) by mouth daily. 90 tablet 3   azelastine (ASTELIN) 0.1 % nasal spray Place 1 spray into both nostrils 2 (two) times daily. 30 mL 12   bictegravir-emtricitabine-tenofovir AF (BIKTARVY) 50-200-25 MG TABS tablet Take 1 tablet by mouth daily. Try to take at the same time each day with or without food. 30 tablet 0   desoximetasone (TOPICORT) 0.25 % cream Apply 1 Application topically 2 (two) times daily. 30 g 0   hydrochlorothiazide (HYDRODIURIL) 25 MG tablet Take 1 tablet (25 mg total) by mouth daily. 90 tablet 3   levocetirizine (XYZAL) 5 MG tablet Take 1 tablet (5 mg total) by mouth at bedtime as needed for allergies (drainage). 90 tablet 3   ondansetron (ZOFRAN-ODT) 4 MG disintegrating tablet Take 1 tablet (4 mg total) by mouth every 8 (eight) hours as needed for nausea or vomiting. 20 tablet 0   sulfamethoxazole-trimethoprim (BACTRIM DS) 800-160 MG tablet Take 1 tablet by mouth daily. 30 tablet 0   tirzepatide (MOUNJARO) 7.5 MG/0.5ML Pen INJECT 7.5 MG INTO THE SKIN ONCE A WEEK. STOP OZEMPIC 6 mL 0   No current facility-administered medications on file prior  to visit.    No Known Allergies    Lab Results HIV 1 RNA Quant (Copies/mL)  Date Value  04/26/2023 179 (H)  01/24/2023 230 (H)  12/14/2022 5,780 (H)   CD4 T Cell Abs (/uL)  Date Value  04/26/2023 133 (L)  01/24/2023 138 (L)  11/30/2022 64 (L)   No results found for: "HIV1GENOSEQ" Lab Results  Component Value Date   WBC 3.0 (L) 04/26/2023   HGB 14.5 04/26/2023   HCT 43.6 04/26/2023   MCV 89.0 04/26/2023   PLT 233 04/26/2023    Lab Results  Component Value Date   CREATININE 1.04 04/26/2023   BUN 13 04/26/2023   NA 139 04/26/2023   K 3.5 04/26/2023   CL 100 04/26/2023   CO2 29 04/26/2023   Lab Results  Component Value Date   ALT 39 04/26/2023   AST  41 (H) 04/26/2023   ALKPHOS 82 03/24/2023   BILITOT 0.7 04/26/2023    Lab Results  Component Value Date   CHOL 129 04/26/2023   TRIG 117 04/26/2023   HDL 37 (L) 04/26/2023   LDLCALC 72 04/26/2023   Lab Results  Component Value Date   HAV REACTIVE (A) 11/30/2022   Lab Results  Component Value Date   HEPBSAG NON-REACTIVE 11/30/2022   HEPBSAB NON-REACTIVE 11/30/2022   Lab Results  Component Value Date   HCVAB NEGATIVE 01/20/2017   Lab Results  Component Value Date   CHLAMYDIAWP Negative 11/30/2022   CHLAMYDIAWP Negative 11/30/2022   CHLAMYDIAWP Negative 11/30/2022   N Negative 11/30/2022   N Negative 11/30/2022   N Negative 11/30/2022   No results found for: "GCPROBEAPT" No results found for: "QUANTGOLD"  Assessment/Plan #HIV Results            Assessment and Plan #HIV -CD4 179, VL,133  on 04/26/23 --on biktarvy -bactrim(ran out) reifillled, counseld on adherence -Interested in injecitbles -Labs today, -Follow-up in 3 months        #Vaccination COVID x2, booster 11/30/22 Flu 04/26/23 Monkeypox not UTD PCV 20 6/4/204  Meningitis not UTD HepA immune HEpB 1st dose  12/14/22, 2nd dose ty 01/24/23 Tdap 11/24/2017 Shingles     #Dm-A1c 6.7 -Management per primary   #Health maintenance -Quantiferon 11/30/22 nr -RPR4/10/24 NR -HCV 11/30/22 NR -GC urine/oral  11/30/22 negative -Lipid 11/30/22, will get today 04/26/23 The ASCVD Risk score (Arnett DK, et al., 2019) failed to calculate for the following reasons:   The valid total cholesterol range is 130 to 320 mg/dL -Dysplasia screen  M 1/61/09 ASCUS, HPV positive(high risk). Referred to GI for HRA.needs HRA. EGD done with GI. -Colonoscopy 5 years ago for mucus per rectum, pt reports found internal hemmahroids    Danelle Earthly, MD [facility] I have personally spent 32 minutes involved in face-to-face and non-face-to-face activities for this patient on the  day of the visit. Professional time spent includes the following activities: Preparing to see the patient (review of tests), Obtaining and/or reviewing separately obtained history (admission/discharge record), Performing a medically appropriate examination and/or evaluation , Ordering medications/tests/procedures, referring and communicating with other health care professionals, Documenting clinical information in the EMR, Independently interpreting results (not separately reported), Communicating results to the patient/family/caregiver, Counseling and educating the patient/family/caregiver and Care coordination (not separately reported).

## 2023-08-08 LAB — T-HELPER CELLS (CD4) COUNT (NOT AT ARMC)
CD4 % Helper T Cell: 16 % — ABNORMAL LOW (ref 33–65)
CD4 T Cell Abs: 191 /uL — ABNORMAL LOW (ref 400–1790)

## 2023-08-09 LAB — CBC WITH DIFFERENTIAL/PLATELET
Absolute Lymphocytes: 1214 {cells}/uL (ref 850–3900)
Absolute Monocytes: 312 {cells}/uL (ref 200–950)
Basophils Absolute: 29 {cells}/uL (ref 0–200)
Basophils Relative: 0.7 %
Eosinophils Absolute: 41 {cells}/uL (ref 15–500)
Eosinophils Relative: 1 %
HCT: 41.9 % (ref 38.5–50.0)
Hemoglobin: 14.1 g/dL (ref 13.2–17.1)
MCH: 30 pg (ref 27.0–33.0)
MCHC: 33.7 g/dL (ref 32.0–36.0)
MCV: 89.1 fL (ref 80.0–100.0)
MPV: 9.1 fL (ref 7.5–12.5)
Monocytes Relative: 7.6 %
Neutro Abs: 2505 {cells}/uL (ref 1500–7800)
Neutrophils Relative %: 61.1 %
Platelets: 216 10*3/uL (ref 140–400)
RBC: 4.7 10*6/uL (ref 4.20–5.80)
RDW: 12.7 % (ref 11.0–15.0)
Total Lymphocyte: 29.6 %
WBC: 4.1 10*3/uL (ref 3.8–10.8)

## 2023-08-09 LAB — COMPLETE METABOLIC PANEL WITH GFR
AG Ratio: 1.5 (calc) (ref 1.0–2.5)
ALT: 17 U/L (ref 9–46)
AST: 18 U/L (ref 10–40)
Albumin: 4.7 g/dL (ref 3.6–5.1)
Alkaline phosphatase (APISO): 60 U/L (ref 36–130)
BUN: 15 mg/dL (ref 7–25)
CO2: 28 mmol/L (ref 20–32)
Calcium: 9.7 mg/dL (ref 8.6–10.3)
Chloride: 105 mmol/L (ref 98–110)
Creat: 1.02 mg/dL (ref 0.60–1.29)
Globulin: 3.2 g/dL (ref 1.9–3.7)
Glucose, Bld: 63 mg/dL — ABNORMAL LOW (ref 65–99)
Potassium: 3.7 mmol/L (ref 3.5–5.3)
Sodium: 141 mmol/L (ref 135–146)
Total Bilirubin: 0.6 mg/dL (ref 0.2–1.2)
Total Protein: 7.9 g/dL (ref 6.1–8.1)
eGFR: 94 mL/min/{1.73_m2} (ref >=60)

## 2023-08-09 LAB — HIV-1 RNA QUANT-NO REFLEX-BLD
HIV 1 RNA Quant: 85 {copies}/mL — ABNORMAL HIGH
HIV-1 RNA Quant, Log: 1.93 {Log} — ABNORMAL HIGH

## 2023-09-20 ENCOUNTER — Other Ambulatory Visit: Payer: Self-pay

## 2023-09-20 NOTE — Progress Notes (Signed)
Specialty Pharmacy Ongoing Clinical Assessment Note  Mark Maddox is a 43 y.o. male who is being followed by the specialty pharmacy service for RxSp HIV   Patient's specialty medication(s) reviewed today: Bictegravir-Emtricitab-Tenofov (Biktarvy)   Missed doses in the last 4 weeks: 0   Patient/Caregiver did not have any additional questions or concerns.   Therapeutic benefit summary: Patient is achieving benefit   Adverse events/side effects summary: No adverse events/side effects   Patient's therapy is appropriate to: Continue    Goals Addressed             This Visit's Progress    Achieve Undetectable HIV Viral Load < 20       Patient is on track. Patient will maintain adherence         Follow up:  6 months  [name] Specialty Pharmacist

## 2023-09-20 NOTE — Progress Notes (Signed)
   Specialty Pharmacy Refill Coordination Note  Mark Maddox is a 43 y.o. male contacted today regarding refills of specialty medication(s) Bictegravir-Emtricitab-Tenofov [name]   Patient requested Delivery   Delivery date: 09/21/23   Verified address: [address] [place] [address]   Medication will be filled on 09/20/23.

## 2023-10-03 ENCOUNTER — Ambulatory Visit: Payer: BC Managed Care – PPO | Admitting: Family Medicine

## 2023-10-04 ENCOUNTER — Other Ambulatory Visit: Payer: Self-pay | Admitting: Family Medicine

## 2023-10-04 DIAGNOSIS — I10 Essential (primary) hypertension: Secondary | ICD-10-CM

## 2023-10-11 ENCOUNTER — Other Ambulatory Visit: Payer: Self-pay

## 2023-10-11 NOTE — Progress Notes (Signed)
 Specialty Pharmacy Refill Coordination Note  Mark Maddox is a 43 y.o. male contacted today regarding refills of specialty medication(s) Bictegravir-Emtricitab-Tenofov [name]   Patient requested Delivery   Delivery date: 10/18/23   Verified address: [address] [place] [address]   Medication will be filled on 10/17/23.

## 2023-10-17 ENCOUNTER — Other Ambulatory Visit: Payer: Self-pay

## 2023-10-26 ENCOUNTER — Ambulatory Visit: Payer: BC Managed Care – PPO | Admitting: Internal Medicine

## 2023-10-31 ENCOUNTER — Other Ambulatory Visit (HOSPITAL_COMMUNITY): Payer: Self-pay

## 2023-11-01 ENCOUNTER — Ambulatory Visit: Payer: BC Managed Care – PPO | Admitting: Family Medicine

## 2023-11-13 ENCOUNTER — Other Ambulatory Visit: Payer: Self-pay

## 2023-11-13 NOTE — Progress Notes (Signed)
 Specialty Pharmacy Refill Coordination Note  ZELIG GACEK is a 43 y.o. male contacted today regarding refills of specialty medication(s) Bictegravir-Emtricitab-Tenofov [name]   Patient requested Delivery   Delivery date: 11/15/23   Verified address: [address]   [place] [address]   Medication will be filled on 11/14/23.

## 2023-11-14 ENCOUNTER — Other Ambulatory Visit: Payer: Self-pay

## 2023-11-14 ENCOUNTER — Other Ambulatory Visit (HOSPITAL_COMMUNITY): Payer: Self-pay

## 2023-11-15 ENCOUNTER — Other Ambulatory Visit: Payer: Self-pay | Admitting: Pharmacist

## 2023-11-15 DIAGNOSIS — B20 Human immunodeficiency virus [HIV] disease: Secondary | ICD-10-CM

## 2023-11-15 MED ORDER — BIKTARVY 50-200-25 MG PO TABS: 1.0000 | ORAL_TABLET | Freq: Every day | ORAL | Status: AC

## 2023-11-15 NOTE — Progress Notes (Signed)
 Medication Samples have been provided to the patient.  Drug name: Biktarvy        Strength: 50/200/25 mg       Qty: 2 bottles (14 tablets)   LOT: [id number]   Exp.Date: 01/2026  Samples requested by Deanna Mabe (awaiting Medicaid).  Dosing instructions: Take one tablet by mouth once daily  The patient has been instructed regarding the correct time, dose, and frequency of taking this medication, including desired effects and most common side effects.   Mariah Gerstenberger L. Jannette Fogo, PharmD, BCIDP, AAHIVP, CPP Clinical Pharmacist Practitioner Infectious Diseases Clinical Pharmacist [facility] 08/03/2020, 10:07 AM

## 2023-11-17 ENCOUNTER — Other Ambulatory Visit (HOSPITAL_COMMUNITY): Payer: Self-pay

## 2023-11-17 ENCOUNTER — Other Ambulatory Visit: Payer: Self-pay

## 2023-11-17 NOTE — Progress Notes (Signed)
 Waiting for medicaid to be approved

## 2023-11-21 ENCOUNTER — Ambulatory Visit: Payer: Self-pay | Admitting: Internal Medicine

## 2023-11-27 ENCOUNTER — Other Ambulatory Visit: Payer: Self-pay | Admitting: Family Medicine

## 2023-11-27 DIAGNOSIS — K760 Fatty (change of) liver, not elsewhere classified: Secondary | ICD-10-CM

## 2023-11-27 DIAGNOSIS — E1169 Type 2 diabetes mellitus with other specified complication: Secondary | ICD-10-CM

## 2023-11-27 DIAGNOSIS — I10 Essential (primary) hypertension: Secondary | ICD-10-CM

## 2023-11-29 ENCOUNTER — Other Ambulatory Visit (HOSPITAL_COMMUNITY): Payer: Self-pay

## 2023-12-01 ENCOUNTER — Other Ambulatory Visit: Payer: Self-pay

## 2023-12-01 ENCOUNTER — Telehealth: Payer: Self-pay

## 2023-12-01 NOTE — Telephone Encounter (Signed)
 Pharmacy Patient Advocate Encounter  Received notification from [facility] that Prior Authorization for Mounjaro 7.5MG /0.5ML auto-injectors has been DENIED.  Full denial letter will be uploaded to the media tab. See denial reason below.   PA #/Case ID/Reference #: [id number]

## 2023-12-01 NOTE — Telephone Encounter (Signed)
 Pharmacy Patient Advocate Encounter   Received notification from CoverMyMeds that prior authorization for [facility] 7.5MG /0.5ML auto-injectors is required/requested.   Insurance verification completed.   The patient is insured through [facility] .   Per test claim: PA required; PA submitted to above mentioned insurance via CoverMyMeds Key/confirmation #/EOC [id number] Status is pending

## 2023-12-02 ENCOUNTER — Other Ambulatory Visit: Payer: Self-pay | Admitting: Family Medicine

## 2023-12-02 DIAGNOSIS — K760 Fatty (change of) liver, not elsewhere classified: Secondary | ICD-10-CM

## 2023-12-02 DIAGNOSIS — E1169 Type 2 diabetes mellitus with other specified complication: Secondary | ICD-10-CM

## 2023-12-04 ENCOUNTER — Other Ambulatory Visit (HOSPITAL_COMMUNITY): Payer: Self-pay

## 2023-12-04 ENCOUNTER — Other Ambulatory Visit: Payer: Self-pay

## 2023-12-04 ENCOUNTER — Encounter: Payer: Self-pay | Admitting: Family Medicine

## 2023-12-04 ENCOUNTER — Ambulatory Visit: Payer: Self-pay | Admitting: Family Medicine

## 2023-12-04 VITALS — BP 136/76 | HR 112 | Temp 98.4°F | Ht 68.0 in | Wt 174.0 lb

## 2023-12-04 DIAGNOSIS — E119 Type 2 diabetes mellitus without complications: Secondary | ICD-10-CM

## 2023-12-04 DIAGNOSIS — B20 Human immunodeficiency virus [HIV] disease: Secondary | ICD-10-CM

## 2023-12-04 DIAGNOSIS — E1159 Type 2 diabetes mellitus with other circulatory complications: Secondary | ICD-10-CM

## 2023-12-04 DIAGNOSIS — Z7985 Long-term (current) use of injectable non-insulin antidiabetic drugs: Secondary | ICD-10-CM

## 2023-12-04 DIAGNOSIS — I152 Hypertension secondary to endocrine disorders: Secondary | ICD-10-CM

## 2023-12-04 DIAGNOSIS — K760 Fatty (change of) liver, not elsewhere classified: Secondary | ICD-10-CM | POA: Diagnosis not present

## 2023-12-04 LAB — BAYER DCA HB A1C WAIVED: HB A1C (BAYER DCA - WAIVED): 4.6 % — ABNORMAL LOW (ref 4.8–5.6)

## 2023-12-04 MED ORDER — METFORMIN HCL 500 MG PO TABS: 500.0000 mg | ORAL_TABLET | Freq: Every day | ORAL | 0 refills | Status: DC

## 2023-12-04 NOTE — Progress Notes (Signed)
 Specialty Pharmacy Refill Coordination Note  Mark Maddox is a 43 y.o. male assessed today regarding refills of clinic administered specialty medication(s) Bictegravir-Emtricitab-Tenofov [facility])   Clinic requested Delivery   Delivery date: 12/06/23   Verified address: [address] [place] [address]   Medication will be filled on 12/05/23.

## 2023-12-04 NOTE — Progress Notes (Signed)
 Subjective: CC:DM PCP: [name], DO WJX:BJYNW D Ogawa is a 43 y.o. male presenting to clinic today for:  1. Type 2 Diabetes with hypertension, NAFLD:  He had been compliant with Mounjaro 7.5 mg up until about 3 weeks ago when his insurance suddenly changed and they stopped paying for the medicine.  He has tried Ozempic in the past and did not meet clinical goals.  Has not been on any orals but is willing to try since this is a requirement by Medicaid.  He is not currently to with any cholesterol medicine but is compliant with his antihypertensives  Diabetes Health Maintenance Due  Topic Date Due   HEMOGLOBIN A1C  09/24/2023   FOOT EXAM  03/23/2024   OPHTHALMOLOGY EXAM  04/17/2024    Last A1c:  Lab Results  Component Value Date   HGBA1C 5.1 03/24/2023    ROS: Denies dizziness, LOC, polyuria, polydipsia, unintended weight loss/gain, foot ulcerations, numbness or tingling in extremities, shortness of breath or chest pain.  2.  HIV Patient is treated with Biktarvy.  He is compliant with medicine but only has about 4 days left of medicine.  He will be seeing his infectious disease provider soon.  He reports that his levels were almost undetectable, very low.  He has not been in any new relationships since diagnosis.  He has no plans to engage in any relationships at this time either.  He does report he has left his previous job at [employer] but is actively seeking alternative employment  ROS: Per HPI  No Known Allergies Past Medical History:  Diagnosis Date   Asthma    had asthma as a child   Diabetes mellitus without complication (HCC)    Enlarged prostate    HIV disease (HCC)    Hypertension    hx of    Inguinal hernia bilateral, non-recurrent    Obesity     Current Outpatient Medications:    amLODipine (NORVASC) 10 MG tablet, TAKE 1 TABLET BY MOUTH EVERY DAY, Disp: 90 tablet, Rfl: 0   azelastine (ASTELIN) 0.1 % nasal spray, Place 1 spray into both nostrils 2  (two) times daily., Disp: 30 mL, Rfl: 12   bictegravir-emtricitabine-tenofovir AF (BIKTARVY) 50-200-25 MG TABS tablet, Take 1 tablet by mouth daily. Try to take at the same time each day with or without food., Disp: 30 tablet, Rfl: 5   desoximetasone (TOPICORT) 0.25 % cream, Apply 1 Application topically 2 (two) times daily., Disp: 30 g, Rfl: 0   hydrochlorothiazide (HYDRODIURIL) 25 MG tablet, TAKE 1 TABLET (25 MG TOTAL) BY MOUTH DAILY., Disp: 90 tablet, Rfl: 0   levocetirizine (XYZAL) 5 MG tablet, Take 1 tablet (5 mg total) by mouth at bedtime as needed for allergies (drainage)., Disp: 90 tablet, Rfl: 3   ondansetron (ZOFRAN-ODT) 4 MG disintegrating tablet, Take 1 tablet (4 mg total) by mouth every 8 (eight) hours as needed for nausea or vomiting., Disp: 20 tablet, Rfl: 0   sulfamethoxazole-trimethoprim (BACTRIM DS) 800-160 MG tablet, Take 1 tablet by mouth daily., Disp: 30 tablet, Rfl: 0   tirzepatide (MOUNJARO) 7.5 MG/0.5ML Pen, Inject 7.5 mg into the skin once a week., Disp: 6 mL, Rfl: 0 Social History   Socioeconomic History   Marital status: Single    Spouse name: Not on file   Number of children: 0   Years of education: Not on file   Highest education level: Not on file  Occupational History   Occupation: truck driver  Tobacco Use  Smoking status: Never    Passive exposure: Past   Smokeless tobacco: Never  Vaping Use   Vaping status: Never Used  Substance and Sexual Activity   Alcohol use: No   Drug use: No   Sexual activity: Not Currently    Partners: Male    Comment: 1 year  Other Topics Concern   Not on file  Social History Narrative   Not on file   Social Drivers of Health   Financial Resource Strain: Not on file  Food Insecurity: Not on file  Transportation Needs: Not on file  Physical Activity: Not on file  Stress: Not on file  Social Connections: Not on file  Intimate Partner Violence: Not on file   Family History  Problem Relation Age of Onset   Diabetes  Maternal Grandmother    Heart disease Maternal Grandfather        heart attack   Diabetes Maternal Grandfather    Prostate cancer Paternal Grandfather    Liver cancer Neg Hx    Colon cancer Neg Hx    Esophageal cancer Neg Hx     Objective: Office vital signs reviewed. BP 136/76   Pulse (!) 112   Temp 98.4 F (36.9 C)   Ht 5\' 8"  (1.727 m)   Wt 174 lb (78.9 kg)   SpO2 97%   BMI 26.46 kg/m   Physical Examination:  General: Awake, alert, overweight, No acute distress HEENT: Sclera white.  Moist mucous membranes. Cardio: regular rate and rhythm, S1S2 heard, no murmurs appreciated Pulm: clear to auscultation bilaterally, no wheezes, rhonchi or rales; normal work of breathing on room air Skin: Patch of hyperpigmented skin along the right temple region    Assessment/ Plan: 43 y.o. male   Diabetes mellitus treated with injections of non-insulin medication (HCC) - Plan: Bayer DCA Hb A1c Waived, metFORMIN (GLUCOPHAGE) 500 MG tablet  Hypertension associated with diabetes (HCC)  NAFLD (nonalcoholic fatty liver disease)  HIV disease (HCC)   Sugar under good control with Mounjaro A1c of less than 5 today.  Sadly, he had to come off of the medication due to insurance coverage issues.  I am going to trial him on metformin and if he does not tolerate, we will resubmit for Mounjaro as he is already failed Ozempic  Blood pressure well-controlled with dual therapy no refills needed  Discussed fasting lipid panel along with fasting labs at next visit  I will reach out to his infectious disease provider to FYI him of need for his Biktarvy as he is almost out of the medication  Eliodoro Guerin, DO [facility] [phone]

## 2023-12-04 NOTE — Telephone Encounter (Signed)
 Name from pharmacy: MOUNJARO 7.5 MG/0.5 ML PEN  Pharmacy comment: Alternative Requested:PRIOR AUTH DENIED; PLEASE APPEAL TO INSURANCE OR CONSIDER ALTERNATIVE.

## 2023-12-04 NOTE — Telephone Encounter (Signed)
 I see him today. Will discuss at appt. Looks like they are forcing metformin.

## 2023-12-05 ENCOUNTER — Other Ambulatory Visit: Payer: Self-pay

## 2023-12-05 ENCOUNTER — Other Ambulatory Visit (HOSPITAL_COMMUNITY): Payer: Self-pay

## 2023-12-11 ENCOUNTER — Encounter: Payer: Self-pay | Admitting: Family Medicine

## 2023-12-11 NOTE — Telephone Encounter (Signed)
 Patient tried on metformin . Cannot tolerate due to GI side effects.  Can you please resubmit his Mounjaro  PA with this new information?

## 2023-12-14 ENCOUNTER — Other Ambulatory Visit: Payer: Self-pay | Admitting: Family Medicine

## 2023-12-14 DIAGNOSIS — J301 Allergic rhinitis due to pollen: Secondary | ICD-10-CM

## 2023-12-20 ENCOUNTER — Other Ambulatory Visit (HOSPITAL_COMMUNITY): Payer: Self-pay

## 2023-12-20 ENCOUNTER — Other Ambulatory Visit: Payer: Self-pay

## 2023-12-20 NOTE — Progress Notes (Signed)
 Specialty Pharmacy Refill Coordination Note  Mark Maddox is a 43 y.o. male contacted today regarding refills of specialty medication(s) Bictegravir-Emtricitab-Tenofov (Biktarvy )   Patient requested (Patient-Rptd) Delivery   Delivery date: (Patient-Rptd) 12/23/23   Verified address: (Patient-Rptd) [address] [place] [address]   Medication will be filled on 05.08.25 when the insurance will pay. Patient will be notified via mychart.

## 2023-12-21 ENCOUNTER — Other Ambulatory Visit: Payer: Self-pay

## 2023-12-21 ENCOUNTER — Ambulatory Visit: Payer: Self-pay | Admitting: Internal Medicine

## 2023-12-21 ENCOUNTER — Other Ambulatory Visit (HOSPITAL_COMMUNITY)
Admission: RE | Admit: 2023-12-21 | Discharge: 2023-12-21 | Disposition: A | Discharge status: Home / Self Care | Source: Ambulatory Visit | Attending: Internal Medicine | Admitting: Internal Medicine

## 2023-12-21 VITALS — BP 148/86 | HR 79 | Temp 97.9°F | Wt 181.6 lb

## 2023-12-21 DIAGNOSIS — Z23 Encounter for immunization: Secondary | ICD-10-CM | POA: Diagnosis not present

## 2023-12-21 DIAGNOSIS — B20 Human immunodeficiency virus [HIV] disease: Secondary | ICD-10-CM

## 2023-12-21 MED ORDER — SULFAMETHOXAZOLE-TRIMETHOPRIM 800-160 MG PO TABS: 1.0000 | ORAL_TABLET | Freq: Every day | ORAL | 5 refills | Status: DC

## 2023-12-21 MED ORDER — BIKTARVY 50-200-25 MG PO TABS: 1.0000 | ORAL_TABLET | Freq: Every day | ORAL | 5 refills | Status: DC

## 2023-12-21 NOTE — Progress Notes (Signed)
 [facility]   HPI: Mark Maddox is a 43 y.o. male  male presents for HIV management. Newly Cx at helath department, HIV 1+, on 10/16/22. 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 Pt works at [employer]. Pt has not been sexually active since July, 2023. Ex was drug addict. Pt has been tested for HIV in 2012 in negative  Today : no missed doses. Quit his job.   PMHx: DM now Monjaro Date of diagnosis: 2/25/'24 ART exposure: no Past Ois: no Risk factors: MSM,  Partners in last 2months 0, in the last 12 months 1.  Anal sex receptive y, insertivey . Contraception sometimes Oral sex, contraception no Vaginal penile sex, contraception no   Social: non drinker/does not smoke ciggerettes Occupation: looking for a job. Remobdling house.  Housing: leaves in house/mom Support: Family aware of diagnosis Understanding of HIV: fair  Past Medical History:  Diagnosis Date   Asthma    had asthma as a child   Diabetes mellitus without complication (HCC)    Enlarged prostate    HIV disease (HCC)    Hypertension    hx of    Inguinal hernia bilateral, non-recurrent    Obesity     Past Surgical History:  Procedure Laterality Date   dental extractions     wisdom teeth   HERNIA REPAIR     INGUINAL HERNIA REPAIR  07/04/2012   Procedure: LAPAROSCOPIC BILATERAL INGUINAL HERNIA REPAIR;  Surgeon: Shela Derby, MD;  Location: [facility] OR;  Service: General;  Laterality: Bilateral;   INSERTION OF MESH  07/04/2012   Procedure: INSERTION OF MESH;  Surgeon: Shela Derby, MD;  Location: [facility] OR;  Service: General;  Laterality: Bilateral;    Family History  Problem Relation Age of Onset   Diabetes Maternal Grandmother    Heart disease Maternal Grandfather        heart attack   Diabetes Maternal Grandfather    Prostate cancer Paternal Grandfather    Liver cancer Neg Hx    Colon cancer Neg Hx    Esophageal cancer Neg Hx     Current Outpatient Medications on File Prior to Visit  Medication Sig Dispense Refill   amLODipine  (NORVASC ) 10 MG tablet TAKE 1 TABLET BY MOUTH EVERY DAY 90 tablet 0   Azelastine  HCl 137 MCG/SPRAY SOLN PLACE 1 SPRAY INTO BOTH NOSTRILS 2 (TWO) TIMES DAILY 90 mL 1   bictegravir-emtricitabine -tenofovir  AF (BIKTARVY ) 50-200-25 MG TABS tablet Take 1 tablet by mouth daily. Try to take at the same time each day with or without food. 30 tablet 5   desoximetasone  (TOPICORT ) 0.25 % cream Apply 1 Application topically 2 (two) times daily. 30 g 0   hydrochlorothiazide  (HYDRODIURIL ) 25 MG tablet TAKE 1 TABLET (25 MG TOTAL) BY MOUTH DAILY. 90 tablet 0   levocetirizine (XYZAL ) 5 MG tablet Take 1 tablet (5 mg total) by mouth at bedtime as needed for allergies (drainage). 90 tablet 3   ondansetron  (ZOFRAN -ODT) 4 MG disintegrating tablet Take 1 tablet (4 mg total) by mouth every 8 (eight) hours as needed for nausea or vomiting. 20 tablet 0   metFORMIN  (GLUCOPHAGE ) 500 MG tablet Take 1 tablet (500 mg total) by mouth daily with breakfast. (Patient not taking: Reported on 12/21/2023) 30 tablet 0   tirzepatide  (MOUNJARO ) 7.5 MG/0.5ML Pen Inject 7.5 mg into the skin once a week. (Patient not taking: Reported on 12/21/2023) 6 mL 0  No current facility-administered medications on file prior to visit.    No Known Allergies    Lab Results HIV 1 RNA Quant (Copies/mL)  Date Value  08/07/2023 85 (H)  04/26/2023 179 (H)  01/24/2023 230 (H)   CD4 T Cell Abs (/uL)  Date Value  08/07/2023 191 (L)  04/26/2023 133 (L)  01/24/2023 138 (L)   No results found for: "HIV1GENOSEQ" Lab Results  Component Value Date   WBC 4.1 08/07/2023   HGB 14.1 08/07/2023   HCT 41.9 08/07/2023   MCV 89.1 08/07/2023   PLT 216 08/07/2023    Lab Results  Component Value Date   CREATININE 1.02 08/07/2023   BUN 15 08/07/2023   NA 141 08/07/2023   K 3.7 08/07/2023   CL 105 08/07/2023   CO2 28 08/07/2023   Lab Results  Component  Value Date   ALT 17 08/07/2023   AST 18 08/07/2023   ALKPHOS 82 03/24/2023   BILITOT 0.6 08/07/2023    Lab Results  Component Value Date   CHOL 129 04/26/2023   TRIG 117 04/26/2023   HDL 37 (L) 04/26/2023   LDLCALC 72 04/26/2023   Lab Results  Component Value Date   HAV REACTIVE (A) 11/30/2022   Lab Results  Component Value Date   HEPBSAG NON-REACTIVE 11/30/2022   HEPBSAB NON-REACTIVE 11/30/2022   Lab Results  Component Value Date   HCVAB NEGATIVE 01/20/2017   Lab Results  Component Value Date   CHLAMYDIAWP Negative 11/30/2022   CHLAMYDIAWP Negative 11/30/2022   CHLAMYDIAWP Negative 11/30/2022   N Negative 11/30/2022   N Negative 11/30/2022   N Negative 11/30/2022   No results found for: "GCPROBEAPT" No results found for: "QUANTGOLD"  Assessment/Plan #HIV -CD4 191, VL85, on 08/07/23 --on biktarvy  -bactrim (ran out) has not taken for amont, counseld on adherence -Interested in injecitbles -Labs today, -Follow-up in 6 months        #Vaccination COVID x2, booster 11/30/22 Flu 04/26/23 Monkeypox not UTD PCV 20 6/4/204  Meningitis  12/21/23 today HepA immune HEpB 1st dose  12/14/22, 2nd dose ty 01/24/23 Tdap 11/24/2017 Shingles     #Dm-A1c 6.7 -Management per primary   #Health maintenance -Quantiferon 11/30/22 nr -RPR today -HCV 11/30/22 NR -GC urin today -Lipid 11/30/22, will get today 04/26/23 The ASCVD Risk score (Arnett DK, et al., 2019) failed to calculate for the following reasons:   The valid total cholesterol range is 130 to 320 mg/dL -Dysplasia screen  M 11/29/79 ASCUS, HPV positive(high risk). Lost insurance-> refer [facility] done with GI. 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  Professional time spent includes the following activities: Preparing to see the patient (review of tests), Obtaining and/or reviewing separately obtained history (admission/discharge record), Performing a medically appropriate examination and/or evaluation , Ordering medications/tests/procedures, referring and communicating with other health care professionals, Documenting clinical information in the EMR, Independently interpreting results (not separately reported), Communicating results to the patient/family/caregiver, Counseling and educating the patient/family/caregiver and Care coordination (not separately reported).

## 2023-12-21 NOTE — Progress Notes (Signed)
 Patients wants to fill at [facility] close to his house.

## 2023-12-22 LAB — CBC WITH DIFFERENTIAL/PLATELET
Absolute Lymphocytes: 1176 {cells}/uL (ref 850–3900)
Absolute Monocytes: 313 {cells}/uL (ref 200–950)
Basophils Absolute: 31 {cells}/uL (ref 0–200)
Basophils Relative: 0.9 %
Eosinophils Absolute: 61 {cells}/uL (ref 15–500)
Eosinophils Relative: 1.8 %
HCT: 42.7 % (ref 38.5–50.0)
Hemoglobin: 14.4 g/dL (ref 13.2–17.1)
MCH: 30.7 pg (ref 27.0–33.0)
MCHC: 33.7 g/dL (ref 32.0–36.0)
MCV: 91 fL (ref 80.0–100.0)
MPV: 8.4 fL (ref 7.5–12.5)
Monocytes Relative: 9.2 %
Neutro Abs: 1819 {cells}/uL (ref 1500–7800)
Neutrophils Relative %: 53.5 %
Platelets: 222 10*3/uL (ref 140–400)
RBC: 4.69 10*6/uL (ref 4.20–5.80)
RDW: 13 % (ref 11.0–15.0)
Total Lymphocyte: 34.6 %
WBC: 3.4 10*3/uL — ABNORMAL LOW (ref 3.8–10.8)

## 2023-12-22 LAB — HEPATITIS C ANTIBODY: Hepatitis C Ab: NONREACTIVE

## 2023-12-22 LAB — T-HELPER CELLS (CD4) COUNT (NOT AT ARMC)
CD4 % Helper T Cell: 14 % — ABNORMAL LOW (ref 33–65)
CD4 T Cell Abs: 164 /uL — ABNORMAL LOW (ref 400–1790)

## 2023-12-22 LAB — URINE CYTOLOGY ANCILLARY ONLY
Chlamydia: NEGATIVE
Comment: NEGATIVE
Comment: NORMAL
Neisseria Gonorrhea: NEGATIVE

## 2023-12-22 LAB — RPR: RPR Ser Ql: NONREACTIVE

## 2023-12-26 ENCOUNTER — Other Ambulatory Visit: Payer: Self-pay | Admitting: Family Medicine

## 2023-12-26 DIAGNOSIS — Z7985 Long-term (current) use of injectable non-insulin antidiabetic drugs: Secondary | ICD-10-CM

## 2024-01-03 ENCOUNTER — Other Ambulatory Visit (HOSPITAL_COMMUNITY): Payer: Self-pay

## 2024-01-03 ENCOUNTER — Encounter: Payer: Self-pay | Admitting: Family Medicine

## 2024-01-03 ENCOUNTER — Telehealth: Payer: Self-pay

## 2024-01-03 NOTE — Telephone Encounter (Signed)
 Pharmacy Patient Advocate Encounter  Received notification from [facility] that Prior Authorization for Mounjaro  7.5MG /0.5ML auto-injectors has been APPROVED from 01/03/24 to 01/02/25. Ran test claim, Copay is $4. This test claim was processed through [facility]- copay amounts may vary at other pharmacies due to pharmacy/plan contracts, or as the patient moves through the different stages of their insurance plan.   PA #/Case ID/Reference #: [id number]

## 2024-01-03 NOTE — Telephone Encounter (Signed)
 Pharmacy Patient Advocate Encounter   Received notification from Patient Advice Request messages that prior authorization for Mounjaro  7.5MG /0.5ML auto-injectors is required/requested.   Insurance verification completed.   The patient is insured through [facility] .   Per test claim: PA required; PA submitted to above mentioned insurance via CoverMyMeds Key/confirmation #/EOC [id number] Status is pending

## 2024-01-12 DIAGNOSIS — Z21 Asymptomatic human immunodeficiency virus [HIV] infection status: Secondary | ICD-10-CM | POA: Diagnosis not present

## 2024-01-12 DIAGNOSIS — K6282 Dysplasia of anus: Secondary | ICD-10-CM | POA: Diagnosis not present

## 2024-01-12 DIAGNOSIS — R85612 Low grade squamous intraepithelial lesion on cytologic smear of anus (LGSIL): Secondary | ICD-10-CM | POA: Diagnosis not present

## 2024-01-12 DIAGNOSIS — R8581 Anal high risk human papillomavirus (HPV) DNA test positive: Secondary | ICD-10-CM | POA: Diagnosis not present

## 2024-01-12 DIAGNOSIS — B20 Human immunodeficiency virus [HIV] disease: Secondary | ICD-10-CM | POA: Diagnosis not present

## 2024-01-24 ENCOUNTER — Encounter: Payer: Self-pay | Admitting: Family Medicine

## 2024-01-24 NOTE — Telephone Encounter (Signed)
 Put him in our SD on Monday

## 2024-01-29 ENCOUNTER — Encounter: Payer: Self-pay | Admitting: Family Medicine

## 2024-01-29 ENCOUNTER — Ambulatory Visit: Admitting: Family Medicine

## 2024-01-29 VITALS — BP 123/78 | HR 87 | Temp 98.3°F | Ht 68.0 in | Wt 175.0 lb

## 2024-01-29 DIAGNOSIS — B20 Human immunodeficiency virus [HIV] disease: Secondary | ICD-10-CM

## 2024-01-29 DIAGNOSIS — M2550 Pain in unspecified joint: Secondary | ICD-10-CM

## 2024-01-29 DIAGNOSIS — M791 Myalgia, unspecified site: Secondary | ICD-10-CM | POA: Diagnosis not present

## 2024-01-29 NOTE — Progress Notes (Signed)
 Subjective: Mark Maddox aches PCP: Eliodoro Guerin, DO Mark Maddox is a 43 y.o. male presenting to clinic today for:  1. Body aches Reports a couple month history of polyarthralgia and muscle pain.  He notes the pain has been so bad that he feels like he has sand in his joints and they hurt to move.  He thought he was just sore after doing some yard work and helping a family member work on a house so he took some Tylenol  but notes that he ended up taking almost the entire bottle in less than 1 week and this really caused him some concern.  Today he presents and notes that symptoms certainly seem to be worse after activity and sometimes will be so severe that it wakes him up from sleep.  He denies any joint swelling, redness or warmth.  He hydrates really well.  He is not sure if this has to do with his HIV status or not but does worry that his T cells have not come up over the course of the last year and a half despite everything else going back into acceptable range.  He has not reached out to his infectious disease doctor to discuss with them.   ROS: Per HPI  No Known Allergies Past Medical History:  Diagnosis Date   Asthma    had asthma as a child   Diabetes mellitus without complication (HCC)    Enlarged prostate    HIV disease (HCC)    Hypertension    hx of    Inguinal hernia bilateral, non-recurrent    Obesity     Current Outpatient Medications:    amLODipine  (NORVASC ) 10 MG tablet, TAKE 1 TABLET BY MOUTH EVERY DAY, Disp: 90 tablet, Rfl: 0   Azelastine  HCl 137 MCG/SPRAY SOLN, PLACE 1 SPRAY INTO BOTH NOSTRILS 2 (TWO) TIMES DAILY, Disp: 90 mL, Rfl: 1   bictegravir-emtricitabine -tenofovir  AF (BIKTARVY ) 50-200-25 MG TABS tablet, Take 1 tablet by mouth daily. Try to take at the same time each day with or without food., Disp: 30 tablet, Rfl: 5   desoximetasone  (TOPICORT ) 0.25 % cream, Apply 1 Application topically 2 (two) times daily., Disp: 30 g, Rfl: 0   hydrochlorothiazide   (HYDRODIURIL ) 25 MG tablet, TAKE 1 TABLET (25 MG TOTAL) BY MOUTH DAILY., Disp: 90 tablet, Rfl: 0   levocetirizine (XYZAL ) 5 MG tablet, Take 1 tablet (5 mg total) by mouth at bedtime as needed for allergies (drainage)., Disp: 90 tablet, Rfl: 3   metFORMIN  (GLUCOPHAGE ) 500 MG tablet, TAKE 1 TABLET BY MOUTH EVERY DAY WITH BREAKFAST, Disp: 90 tablet, Rfl: 1   ondansetron  (ZOFRAN -ODT) 4 MG disintegrating tablet, Take 1 tablet (4 mg total) by mouth every 8 (eight) hours as needed for nausea or vomiting., Disp: 20 tablet, Rfl: 0   sulfamethoxazole -trimethoprim  (BACTRIM  DS) 800-160 MG tablet, Take 1 tablet by mouth daily., Disp: 30 tablet, Rfl: 5   tirzepatide  (MOUNJARO ) 7.5 MG/0.5ML Pen, Inject 7.5 mg into the skin once a week. (Patient not taking: Reported on 12/21/2023), Disp: 6 mL, Rfl: 0 Social History   Socioeconomic History   Marital status: Single    Spouse name: Not on file   Number of children: 0   Years of education: Not on file   Highest education level: Not on file  Occupational History   Occupation: truck driver  Tobacco Use   Smoking status: Never    Passive exposure: Past   Smokeless tobacco: Never  Vaping Use   Vaping status: Never  Used  Substance and Sexual Activity   Alcohol use: No   Drug use: No   Sexual activity: Not Currently    Partners: Male    Comment: 1 year  Other Topics Concern   Not on file  Social History Narrative   Not on file   Social Drivers of Health   Financial Resource Strain: Not on file  Food Insecurity: Not on file  Transportation Needs: Not on file  Physical Activity: Not on file  Stress: Not on file  Social Connections: Not on file  Intimate Partner Violence: Not on file   Family History  Problem Relation Age of Onset   Diabetes Maternal Grandmother    Heart disease Maternal Grandfather        heart attack   Diabetes Maternal Grandfather    Prostate cancer Paternal Grandfather    Liver cancer Neg Hx    Colon cancer Neg Hx     Esophageal cancer Neg Hx     Objective: Office vital signs reviewed. BP 123/78   Pulse 87   Temp 98.3 F (36.8 C)   Ht 5\' 8"  (1.727 m)   Wt 175 lb (79.4 kg)   SpO2 97%   BMI 26.61 kg/m   Physical Examination:  General: Awake, alert, well nourished, No acute distress HEENT: sclera white, MMM MSK: No gross joint swelling, deformity or erythema.  No warmth.  No atrophy of the muscles.  No tenderness to palpation to the large muscle groups including thighs, calves or paraspinal muscles.   Assessment/ Plan: 43 y.o. male   Myalgia - Plan: CBC with Differential, CMP14+EGFR, CK, ANA w/Reflex if Positive, C-reactive protein, Sedimentation Rate, Alpha-Gal Panel  Polyarthralgia - Plan: Uric Acid, Rheumatoid factor  HIV disease (HCC)  Refractory to Tylenol  use.  Does not respond to NSAID use.  Will look for autoimmune diseases.  Will also look for infectious etiology with alpha gal panel.  I will reach out to infectious disease to see if they have any thoughts about possible causes.  Will also have clinical pharmacy take a look into possible affiliation with Biktarvy  use   Mark Zarr Bambi Bonine, DO [facility] [phone]

## 2024-01-30 ENCOUNTER — Ambulatory Visit: Payer: Self-pay | Admitting: Family Medicine

## 2024-01-30 DIAGNOSIS — R700 Elevated erythrocyte sedimentation rate: Secondary | ICD-10-CM

## 2024-01-30 DIAGNOSIS — M2550 Pain in unspecified joint: Secondary | ICD-10-CM

## 2024-01-30 DIAGNOSIS — M353 Polymyalgia rheumatica: Secondary | ICD-10-CM

## 2024-02-01 LAB — URIC ACID: Uric Acid: 5.2 mg/dL (ref 3.8–8.4)

## 2024-02-01 LAB — CBC WITH DIFFERENTIAL/PLATELET
Basophils Absolute: 0 10*3/uL (ref 0.0–0.2)
Basos: 1 %
EOS (ABSOLUTE): 0.1 10*3/uL (ref 0.0–0.4)
Eos: 1 %
Hematocrit: 45.8 % (ref 37.5–51.0)
Hemoglobin: 15.5 g/dL (ref 13.0–17.7)
Immature Grans (Abs): 0 10*3/uL (ref 0.0–0.1)
Immature Granulocytes: 0 %
Lymphocytes Absolute: 2 10*3/uL (ref 0.7–3.1)
Lymphs: 42 %
MCH: 31 pg (ref 26.6–33.0)
MCHC: 33.8 g/dL (ref 31.5–35.7)
MCV: 92 fL (ref 79–97)
Monocytes Absolute: 0.3 10*3/uL (ref 0.1–0.9)
Monocytes: 7 %
Neutrophils Absolute: 2.3 10*3/uL (ref 1.4–7.0)
Neutrophils: 49 %
Platelets: 245 10*3/uL (ref 150–450)
RBC: 5 x10E6/uL (ref 4.14–5.80)
RDW: 12.3 % (ref 11.6–15.4)
WBC: 4.7 10*3/uL (ref 3.4–10.8)

## 2024-02-01 LAB — ALPHA-GAL PANEL
Beef IgE: 0.12 kU/L — AB
IgE (Immunoglobulin E), Serum: 34 [IU]/mL (ref 6–495)
O215-IgE Alpha-Gal: 0.19 kU/L — AB

## 2024-02-01 LAB — CMP14+EGFR
ALT: 17 IU/L (ref 0–44)
AST: 22 IU/L (ref 0–40)
Albumin: 5.2 g/dL — ABNORMAL HIGH (ref 4.1–5.1)
Alkaline Phosphatase: 79 IU/L (ref 44–121)
BUN/Creatinine Ratio: 16 (ref 9–20)
BUN: 18 mg/dL (ref 6–24)
Bilirubin Total: 0.4 mg/dL (ref 0.0–1.2)
CO2: 21 mmol/L (ref 20–29)
Calcium: 9.9 mg/dL (ref 8.7–10.2)
Chloride: 97 mmol/L (ref 96–106)
Creatinine, Ser: 1.13 mg/dL (ref 0.76–1.27)
Globulin, Total: 3.1 g/dL (ref 1.5–4.5)
Glucose: 79 mg/dL (ref 70–99)
Potassium: 3.7 mmol/L (ref 3.5–5.2)
Sodium: 136 mmol/L (ref 134–144)
Total Protein: 8.3 g/dL (ref 6.0–8.5)
eGFR: 83 mL/min/{1.73_m2} (ref >59)

## 2024-02-01 LAB — ANA W/REFLEX IF POSITIVE: Anti Nuclear Antibody (ANA): NEGATIVE

## 2024-02-01 LAB — CK: Total CK: 112 U/L (ref 49–439)

## 2024-02-01 LAB — RHEUMATOID FACTOR: Rheumatoid fact SerPl-aCnc: <10 [IU]/mL

## 2024-02-01 LAB — SEDIMENTATION RATE: Sed Rate: 21 mm/h — ABNORMAL HIGH (ref 0–15)

## 2024-02-01 LAB — C-REACTIVE PROTEIN: CRP: 1 mg/L (ref 0–10)

## 2024-02-02 ENCOUNTER — Encounter: Payer: Self-pay | Admitting: Internal Medicine

## 2024-02-28 ENCOUNTER — Other Ambulatory Visit: Payer: Self-pay | Admitting: Family Medicine

## 2024-02-28 DIAGNOSIS — I10 Essential (primary) hypertension: Secondary | ICD-10-CM

## 2024-02-29 ENCOUNTER — Encounter: Payer: Self-pay | Admitting: Internal Medicine

## 2024-02-29 ENCOUNTER — Other Ambulatory Visit: Payer: Self-pay

## 2024-02-29 ENCOUNTER — Ambulatory Visit: Admitting: Internal Medicine

## 2024-02-29 VITALS — BP 134/91 | HR 81 | Temp 98.4°F | Ht 68.0 in | Wt 174.0 lb

## 2024-02-29 DIAGNOSIS — B20 Human immunodeficiency virus [HIV] disease: Secondary | ICD-10-CM | POA: Diagnosis not present

## 2024-02-29 NOTE — Progress Notes (Signed)
 Patient Active Problem List   Diagnosis Date Noted   Diabetes mellitus type II, controlled (HCC) 01/04/2023   HIV disease (HCC) 01/04/2023   High risk HPV infection 01/04/2023   Seasonal allergic rhinitis due to pollen 10/05/2022   Overweight with body mass index (BMI) 25.0-29.9 11/24/2017   Hypertension associated with diabetes (HCC) 11/24/2017   NAFLD (nonalcoholic fatty liver disease) 95/94/7980   Irritable bowel syndrome 03/27/2017   BPH (benign prostatic hyperplasia) 03/27/2017    Patient's Medications  New Prescriptions   No medications on file  Previous Medications   AMLODIPINE  (NORVASC ) 10 MG TABLET    TAKE 1 TABLET BY MOUTH EVERY DAY   AZELASTINE  HCL 137 MCG/SPRAY SOLN    PLACE 1 SPRAY INTO BOTH NOSTRILS 2 (TWO) TIMES DAILY   BICTEGRAVIR-EMTRICITABINE -TENOFOVIR  AF (BIKTARVY ) 50-200-25 MG TABS TABLET    Take 1 tablet by mouth daily. Try to take at the same time each day with or without food.   DESOXIMETASONE  (TOPICORT ) 0.25 % CREAM    Apply 1 Application topically 2 (two) times daily.   HYDROCHLOROTHIAZIDE  (HYDRODIURIL ) 25 MG TABLET    TAKE 1 TABLET (25 MG TOTAL) BY MOUTH DAILY.   LEVOCETIRIZINE (XYZAL ) 5 MG TABLET    Take 1 tablet (5 mg total) by mouth at bedtime as needed for allergies (drainage).   METFORMIN  (GLUCOPHAGE ) 500 MG TABLET    TAKE 1 TABLET BY MOUTH EVERY DAY WITH BREAKFAST   ONDANSETRON  (ZOFRAN -ODT) 4 MG DISINTEGRATING TABLET    Take 1 tablet (4 mg total) by mouth every 8 (eight) hours as needed for nausea or vomiting.   SULFAMETHOXAZOLE -TRIMETHOPRIM  (BACTRIM  DS) 800-160 MG TABLET    Take 1 tablet by mouth daily.   TIRZEPATIDE  (MOUNJARO ) 7.5 MG/0.5ML PEN    Inject 7.5 mg into the skin once a week.  Modified Medications   No medications on file  Discontinued Medications   No medications on file    Subjective: [name] is a 43 y.o. male  male presents for HIV management. Newly Cx at helath department, HIV 1+, on 10/16/22. Found to be HIV +  positive as part open enrollment at his job for life insurance. Pt works at [employer]. Pt has not been sexually active since July, 2023. Ex was drug addict. Pt has been tested for HIV in 2012 in negative  Today : presents with right arm muscle weakness with activity    PMHx: DM now Monjaro Date of diagnosis: 2/25/'24 ART exposure: no Past Ois: no Risk factors: MSM,  Partners in last 2months 0, in the last 12 months 1.  Anal sex receptive y, insertivey . Contraception sometimes Oral sex, contraception no Vaginal penile sex, contraception no   Social: non drinker/does not smoke ciggerettes Occupation: looking for a job. Remobdling house.  Housing: leaves in house/mom Support: Family aware of diagnosis Understanding of HIV: fair   Review of Systems: Review of Systems  All other systems reviewed and are negative.   Past Medical History:  Diagnosis Date   Asthma    had asthma as a child   Diabetes mellitus without complication (HCC)    Enlarged prostate    HIV disease (HCC)    Hypertension    hx of    Inguinal hernia bilateral, non-recurrent    Obesity     Social History   Tobacco Use   Smoking status: Never    Passive exposure: Past   Smokeless tobacco: Never  Vaping Use  Vaping status: Never Used  Substance Use Topics   Alcohol use: No   Drug use: No    Family History  Problem Relation Age of Onset   Diabetes Maternal Grandmother    Heart disease Maternal Grandfather        heart attack   Diabetes Maternal Grandfather    Prostate cancer Paternal Grandfather    Liver cancer Neg Hx    Colon cancer Neg Hx    Esophageal cancer Neg Hx     No Known Allergies  Health Maintenance  Topic Date Due   HPV VACCINES (1 - Risk 3-dose SCDM series) Never done   COVID-19 Vaccine (4 - 2024-25 season) 04/23/2023   Diabetic kidney evaluation - Urine ACR  03/23/2024   INFLUENZA VACCINE  03/22/2024   FOOT EXAM  03/23/2024   OPHTHALMOLOGY EXAM  04/17/2024   HEMOGLOBIN A1C   06/04/2024   Diabetic kidney evaluation - eGFR measurement  01/28/2025   DTaP/Tdap/Td (2 - Td or Tdap) 11/25/2027   Pneumococcal Vaccine 60-71 Years old  Completed   Hepatitis B Vaccines  Completed   Hepatitis C Screening  Completed   Meningococcal B Vaccine  Aged Out   HIV Screening  Discontinued    Objective:  Vitals:   02/29/24 1441  BP: (!) 134/91  Pulse: 81  Temp: 98.4 F (36.9 C)  TempSrc: Temporal  SpO2: 98%  Weight: 174 lb (78.9 kg)  Height: 5' 8 (1.727 m)   Body mass index is 26.46 kg/m.  Physical Exam Constitutional:      General: He is not in acute distress.    Appearance: He is normal weight. He is not toxic-appearing.  HENT:     Head: Normocephalic and atraumatic.     Right Ear: External ear normal.     Left Ear: External ear normal.     Nose: No congestion or rhinorrhea.     Mouth/Throat:     Mouth: Mucous membranes are moist.     Pharynx: Oropharynx is clear.  Eyes:     Extraocular Movements: Extraocular movements intact.     Conjunctiva/sclera: Conjunctivae normal.     Pupils: Pupils are equal, round, and reactive to light.  Cardiovascular:     Rate and Rhythm: Normal rate and regular rhythm.     Heart sounds: No murmur heard.    No friction rub. No gallop.  Pulmonary:     Effort: Pulmonary effort is normal.     Breath sounds: Normal breath sounds.  Abdominal:     General: Abdomen is flat. Bowel sounds are normal.     Palpations: Abdomen is soft.  Musculoskeletal:        General: No swelling. Normal range of motion.     Cervical back: Normal range of motion and neck supple.  Skin:    General: Skin is warm and dry.  Neurological:     General: No focal deficit present.     Mental Status: He is oriented to person, place, and time.  Psychiatric:        Mood and Affect: Mood normal.    Physical Exam   Lab Results Lab Results  Component Value Date   WBC 4.7 01/29/2024   HGB 15.5 01/29/2024   HCT 45.8 01/29/2024   MCV 92 01/29/2024    PLT 245 01/29/2024    Lab Results  Component Value Date   CREATININE 1.13 01/29/2024   BUN 18 01/29/2024   NA 136 01/29/2024   K 3.7 01/29/2024  CL 97 01/29/2024   CO2 21 01/29/2024    Lab Results  Component Value Date   ALT 17 01/29/2024   AST 22 01/29/2024   ALKPHOS 79 01/29/2024   BILITOT 0.4 01/29/2024    Lab Results  Component Value Date   CHOL 129 04/26/2023   HDL 37 (L) 04/26/2023   LDLCALC 72 04/26/2023   TRIG 117 04/26/2023   CHOLHDL 3.5 04/26/2023   Lab Results  Component Value Date   LABRPR NON-REACTIVE 12/21/2023   HIV 1 RNA Quant (Copies/mL)  Date Value  08/07/2023 85 (H)  04/26/2023 179 (H)  01/24/2023 230 (H)   CD4 T Cell Abs (/uL)  Date Value  12/21/2023 164 (L)  08/07/2023 191 (L)  04/26/2023 133 (L)     Problem List Items Addressed This Visit   None  Results   Assessment/Plan #HIV/AIDS HIV labs Continue biktarvy  and bactrim  F/U with ID in a month #Muscle weakness 2 months ago fatigues, night sweats. Then had soreness all over. Symotms have iproved except for soreness back knees. Referred to rheumatology. All other labs normal except esr 21. Pt started maujoro shortly after biktarvy .  -Pt stopped eating beef given allergy result -Will get some basic labs, mg, ck, today. I don't think this is related to biktarvy /bactirm(discussed with pharmacy as well), possible muscle loss from monjouro?   [name], MD [facility] 02/29/2024, 3:05 PM   I have personally spent 45 minutes involved in face-to-face and non-face-to-face activities for this patient on the day of the visit. Professional time spent includes the following activities: Preparing to see the patient (review of tests), Obtaining and/or reviewing separately obtained history (admission/discharge record), Performing a medically appropriate examination and/or evaluation , Ordering medications/tests/procedures, referring and  communicating with other health care professionals, Documenting clinical information in the EMR, Independently interpreting results (not separately reported), Communicating results to the patient/family/caregiver, Counseling and educating the patient/family/caregiver and Care coordination (not separately reported).

## 2024-03-01 LAB — T-HELPER CELLS (CD4) COUNT (NOT AT ARMC)
CD4 % Helper T Cell: 15 % — ABNORMAL LOW (ref 33–65)
CD4 T Cell Abs: 291 /uL — ABNORMAL LOW (ref 400–1790)

## 2024-03-02 LAB — COMPLETE METABOLIC PANEL WITHOUT GFR
AG Ratio: 1.6 (calc) (ref 1.0–2.5)
ALT: 20 U/L (ref 9–46)
AST: 20 U/L (ref 10–40)
Albumin: 5.3 g/dL — ABNORMAL HIGH (ref 3.6–5.1)
Alkaline phosphatase (APISO): 63 U/L (ref 36–130)
BUN: 13 mg/dL (ref 7–25)
CO2: 28 mmol/L (ref 20–32)
Calcium: 9.7 mg/dL (ref 8.6–10.3)
Chloride: 99 mmol/L (ref 98–110)
Creat: 1.04 mg/dL (ref 0.60–1.29)
Globulin: 3.3 g/dL (ref 1.9–3.7)
Glucose, Bld: 83 mg/dL (ref 65–99)
Potassium: 3.4 mmol/L — ABNORMAL LOW (ref 3.5–5.3)
Sodium: 138 mmol/L (ref 135–146)
Total Bilirubin: 0.6 mg/dL (ref 0.2–1.2)
Total Protein: 8.6 g/dL — ABNORMAL HIGH (ref 6.1–8.1)

## 2024-03-02 LAB — CBC WITH DIFFERENTIAL/PLATELET
Absolute Lymphocytes: 1933 {cells}/uL (ref 850–3900)
Absolute Monocytes: 291 {cells}/uL (ref 200–950)
Basophils Absolute: 41 {cells}/uL (ref 0–200)
Basophils Relative: 0.8 %
Eosinophils Absolute: 71 {cells}/uL (ref 15–500)
Eosinophils Relative: 1.4 %
HCT: 45.4 % (ref 38.5–50.0)
Hemoglobin: 15.6 g/dL (ref 13.2–17.1)
MCH: 30.6 pg (ref 27.0–33.0)
MCHC: 34.4 g/dL (ref 32.0–36.0)
MCV: 89 fL (ref 80.0–100.0)
MPV: 8.7 fL (ref 7.5–12.5)
Monocytes Relative: 5.7 %
Neutro Abs: 2764 {cells}/uL (ref 1500–7800)
Neutrophils Relative %: 54.2 %
Platelets: 255 Thousand/uL (ref 140–400)
RBC: 5.1 Million/uL (ref 4.20–5.80)
RDW: 12.7 % (ref 11.0–15.0)
Total Lymphocyte: 37.9 %
WBC: 5.1 Thousand/uL (ref 3.8–10.8)

## 2024-03-02 LAB — CK: Total CK: 90 U/L (ref 26–366)

## 2024-03-02 LAB — CYCLIC CITRUL PEPTIDE ANTIBODY, IGG: Cyclic Citrullin Peptide Ab: <16 U

## 2024-03-02 LAB — HIV-1 RNA QUANT-NO REFLEX-BLD
HIV 1 RNA Quant: 31 {copies}/mL — ABNORMAL HIGH
HIV-1 RNA Quant, Log: 1.49 {Log_copies}/mL — ABNORMAL HIGH

## 2024-03-02 LAB — MAGNESIUM: Magnesium: 2.2 mg/dL (ref 1.5–2.5)

## 2024-03-08 DIAGNOSIS — B20 Human immunodeficiency virus [HIV] disease: Secondary | ICD-10-CM | POA: Diagnosis not present

## 2024-03-08 DIAGNOSIS — K6282 Dysplasia of anus: Secondary | ICD-10-CM | POA: Diagnosis not present

## 2024-03-09 ENCOUNTER — Other Ambulatory Visit (HOSPITAL_COMMUNITY): Payer: Self-pay

## 2024-03-20 ENCOUNTER — Other Ambulatory Visit: Payer: Self-pay | Admitting: Family Medicine

## 2024-03-20 DIAGNOSIS — E1169 Type 2 diabetes mellitus with other specified complication: Secondary | ICD-10-CM

## 2024-03-20 DIAGNOSIS — K760 Fatty (change of) liver, not elsewhere classified: Secondary | ICD-10-CM

## 2024-04-10 ENCOUNTER — Ambulatory Visit: Admitting: Internal Medicine

## 2024-05-28 ENCOUNTER — Encounter: Admitting: Internal Medicine

## 2024-06-07 ENCOUNTER — Encounter: Payer: Self-pay | Admitting: Family Medicine

## 2024-06-10 ENCOUNTER — Encounter: Admitting: Family Medicine

## 2024-06-14 ENCOUNTER — Other Ambulatory Visit: Payer: Self-pay | Admitting: Family Medicine

## 2024-06-14 DIAGNOSIS — K760 Fatty (change of) liver, not elsewhere classified: Secondary | ICD-10-CM

## 2024-06-25 NOTE — Telephone Encounter (Signed)
 Luke, can you put him in for 11/11 at 10am for DM recheck?

## 2024-06-27 ENCOUNTER — Ambulatory Visit: Admitting: Internal Medicine

## 2024-07-02 ENCOUNTER — Ambulatory Visit (INDEPENDENT_AMBULATORY_CARE_PROVIDER_SITE_OTHER): Admitting: Family Medicine

## 2024-07-02 ENCOUNTER — Encounter: Payer: Self-pay | Admitting: Family Medicine

## 2024-07-02 VITALS — BP 121/80 | HR 79 | Temp 98.2°F | Ht 68.0 in | Wt 178.0 lb

## 2024-07-02 DIAGNOSIS — E1169 Type 2 diabetes mellitus with other specified complication: Secondary | ICD-10-CM

## 2024-07-02 DIAGNOSIS — E876 Hypokalemia: Secondary | ICD-10-CM | POA: Diagnosis not present

## 2024-07-02 DIAGNOSIS — I152 Hypertension secondary to endocrine disorders: Secondary | ICD-10-CM

## 2024-07-02 DIAGNOSIS — Z7985 Long-term (current) use of injectable non-insulin antidiabetic drugs: Secondary | ICD-10-CM | POA: Diagnosis not present

## 2024-07-02 DIAGNOSIS — E785 Hyperlipidemia, unspecified: Secondary | ICD-10-CM

## 2024-07-02 DIAGNOSIS — K760 Fatty (change of) liver, not elsewhere classified: Secondary | ICD-10-CM | POA: Diagnosis not present

## 2024-07-02 DIAGNOSIS — E1159 Type 2 diabetes mellitus with other circulatory complications: Secondary | ICD-10-CM | POA: Diagnosis not present

## 2024-07-02 LAB — BAYER DCA HB A1C WAIVED: HB A1C (BAYER DCA - WAIVED): 4.7 % — ABNORMAL LOW (ref 4.8–5.6)

## 2024-07-02 MED ORDER — HYDROCHLOROTHIAZIDE 25 MG PO TABS: 25.0000 mg | ORAL_TABLET | Freq: Every day | ORAL | 4 refills | Status: AC

## 2024-07-02 MED ORDER — AMLODIPINE BESYLATE 10 MG PO TABS: 10.0000 mg | ORAL_TABLET | Freq: Every day | ORAL | 4 refills | Status: AC

## 2024-07-02 MED ORDER — TIRZEPATIDE 10 MG/0.5ML ~~LOC~~ SOAJ: 10.0000 mg | SUBCUTANEOUS | 4 refills | Status: AC

## 2024-07-02 NOTE — Patient Instructions (Signed)
 Get diabetic eye exam done.

## 2024-07-02 NOTE — Progress Notes (Signed)
 Subjective: CC:DM PCP: Jolinda Norene HERO, DO YEP:Mark Maddox is a 43 y.o. male presenting to clinic today for:  Type 2 Diabetes with hypertension, hyperlipidemia and NAFLD:  Compliant with all meds. No concerns today.  Not having any GI side effects from the Mounjaro . RARE use of ETOH.  ROS: No chest pain, shortness of breath, neuropathy, polydipsia or polyuria.  He had an episode of diarrhea recently while on a cruise but that has fully resolved.   Diabetes Health Maintenance Due  Topic Date Due   FOOT EXAM  03/23/2024   OPHTHALMOLOGY EXAM  04/17/2024   HEMOGLOBIN A1C  06/04/2024    ROS: Per HPI  No Known Allergies Past Medical History:  Diagnosis Date   Asthma    had asthma as a child   Diabetes mellitus without complication (HCC)    Enlarged prostate    HIV disease (HCC)    Hypertension    hx of    Inguinal hernia bilateral, non-recurrent    Obesity     Current Outpatient Medications:    amLODipine  (NORVASC ) 10 MG tablet, TAKE 1 TABLET BY MOUTH EVERY DAY, Disp: 90 tablet, Rfl: 0   Azelastine  HCl 137 MCG/SPRAY SOLN, PLACE 1 SPRAY INTO BOTH NOSTRILS 2 (TWO) TIMES DAILY, Disp: 90 mL, Rfl: 1   bictegravir-emtricitabine -tenofovir  AF (BIKTARVY ) 50-200-25 MG TABS tablet, Take 1 tablet by mouth daily. Try to take at the same time each day with or without food., Disp: 30 tablet, Rfl: 5   desoximetasone  (TOPICORT ) 0.25 % cream, Apply 1 Application topically 2 (two) times daily. (Patient not taking: Reported on 02/29/2024), Disp: 30 g, Rfl: 0   hydrochlorothiazide  (HYDRODIURIL ) 25 MG tablet, TAKE 1 TABLET (25 MG TOTAL) BY MOUTH DAILY., Disp: 90 tablet, Rfl: 1   levocetirizine (XYZAL ) 5 MG tablet, Take 1 tablet (5 mg total) by mouth at bedtime as needed for allergies (drainage). (Patient not taking: Reported on 02/29/2024), Disp: 90 tablet, Rfl: 3   metFORMIN  (GLUCOPHAGE ) 500 MG tablet, TAKE 1 TABLET BY MOUTH EVERY DAY WITH BREAKFAST (Patient not taking: Reported on 02/29/2024),  Disp: 90 tablet, Rfl: 1   ondansetron  (ZOFRAN -ODT) 4 MG disintegrating tablet, Take 1 tablet (4 mg total) by mouth every 8 (eight) hours as needed for nausea or vomiting., Disp: 20 tablet, Rfl: 0   sulfamethoxazole -trimethoprim  (BACTRIM  DS) 800-160 MG tablet, Take 1 tablet by mouth daily., Disp: 30 tablet, Rfl: 5   tirzepatide  (MOUNJARO ) 7.5 MG/0.5ML Pen, INJECT 7.5 MG SUBCUTANEOUSLY WEEKLY, Disp: 6 mL, Rfl: 3 Social History   Socioeconomic History   Marital status: Single    Spouse name: Not on file   Number of children: 0   Years of education: Not on file   Highest education level: 12th grade  Occupational History   Occupation: truck driver  Tobacco Use   Smoking status: Never    Passive exposure: Past   Smokeless tobacco: Never  Vaping Use   Vaping status: Never Used  Substance and Sexual Activity   Alcohol use: No   Drug use: No   Sexual activity: Not Currently    Partners: Male    Comment: 1 year  Other Topics Concern   Not on file  Social History Narrative   Not on file   Social Drivers of Health   Financial Resource Strain: Low Risk  (07/01/2024)   Overall Financial Resource Strain (CARDIA)    Difficulty of Paying Living Expenses: Not hard at all  Food Insecurity: No Food Insecurity (07/01/2024)  Hunger Vital Sign    Worried About Running Out of Food in the Last Year: Never true    Ran Out of Food in the Last Year: Never true  Transportation Needs: No Transportation Needs (07/01/2024)   PRAPARE - Administrator, Civil Service (Medical): No    Lack of Transportation (Non-Medical): No  Physical Activity: Sufficiently Active (07/01/2024)   Exercise Vital Sign    Days of Exercise per Week: 5 days    Minutes of Exercise per Session: 30 min  Stress: No Stress Concern Present (07/01/2024)   [employer] of Occupational Health - Occupational Stress Questionnaire    Feeling of Stress: Not at all  Social Connections: Unknown (07/01/2024)   Social  Connection and Isolation Panel    Frequency of Communication with Friends and Family: More than three times a week    Frequency of Social Gatherings with Friends and Family: More than three times a week    Attends Religious Services: Never    Database Administrator or Organizations: No    Attends Engineer, Structural: Not on file    Marital Status: Patient declined  Catering Manager Violence: Not on file   Family History  Problem Relation Age of Onset   Diabetes Maternal Grandmother    Heart disease Maternal Grandfather        heart attack   Diabetes Maternal Grandfather    Prostate cancer Paternal Grandfather    Liver cancer Neg Hx    Colon cancer Neg Hx    Esophageal cancer Neg Hx     Objective: Office vital signs reviewed. BP 121/80   Pulse 79   Temp 98.2 F (36.8 C)   Ht 5' 8 (1.727 m)   Wt 178 lb (80.7 kg)   SpO2 94%   BMI 27.06 kg/m   Physical Examination:  General: Awake, alert, well nourished, No acute distress HEENT: sclera white, MMM Cardio: regular rate and rhythm, S1S2 heard, no murmurs appreciated Pulm: clear to auscultation bilaterally, no wheezes, rhonchi or rales; normal work of breathing on room air  Diabetic Foot Exam - Simple   Simple Foot Form Diabetic Foot exam was performed with the following findings: Yes 07/02/2024  9:44 AM  Visual Inspection No deformities, no ulcerations, no other skin breakdown bilaterally: Yes Sensation Testing Intact to touch and monofilament testing bilaterally: Yes Pulse Check Posterior Tibialis and Dorsalis pulse intact bilaterally: Yes Comments     Lab Results  Component Value Date   HGBA1C 4.6 (L) 12/04/2023    Assessment/ Plan: 43 y.o. male   Diabetes mellitus treated with injections of non-insulin medication (HCC) - Plan: Microalbumin / creatinine urine ratio, Bayer DCA Hb A1c Waived, Basic Metabolic Panel, tirzepatide  (MOUNJARO ) 10 MG/0.5ML Pen  Hypertension associated with diabetes (HCC) -  Plan: Basic Metabolic Panel, amLODipine  (NORVASC ) 10 MG tablet, hydrochlorothiazide  (HYDRODIURIL ) 25 MG tablet  Hyperlipidemia associated with type 2 diabetes mellitus (HCC) - Plan: Lipid Panel  Hypokalemia - Plan: Basic Metabolic Panel, Magnesium  NAFLD (nonalcoholic fatty liver disease) - Plan: tirzepatide  (MOUNJARO ) 10 MG/0.5ML Pen  Sugar under excellent control.  Down from A1c of 6.7 to 4.7 today. Urine micro collected. DM foot exam today.  DM eye exam to be scheduled. Advance mounjaro  to 10mg  as he has not met clinical goals for weight yet and has NAFLD.    BP stable. Meds renewed  Lipid panel and BMP collected.  Follow up in 34m for CPE   Mark Teixeira M Girtha Kilgore, DO [facility] [phone]

## 2024-07-03 ENCOUNTER — Encounter: Payer: Self-pay | Admitting: Internal Medicine

## 2024-07-03 ENCOUNTER — Other Ambulatory Visit: Payer: Self-pay

## 2024-07-03 ENCOUNTER — Ambulatory Visit: Payer: Self-pay | Admitting: Family Medicine

## 2024-07-03 ENCOUNTER — Ambulatory Visit (INDEPENDENT_AMBULATORY_CARE_PROVIDER_SITE_OTHER): Admitting: Internal Medicine

## 2024-07-03 VITALS — BP 159/83 | HR 80 | Ht 68.0 in | Wt 185.0 lb

## 2024-07-03 DIAGNOSIS — B20 Human immunodeficiency virus [HIV] disease: Secondary | ICD-10-CM

## 2024-07-03 DIAGNOSIS — E119 Type 2 diabetes mellitus without complications: Secondary | ICD-10-CM | POA: Diagnosis not present

## 2024-07-03 DIAGNOSIS — Z79899 Other long term (current) drug therapy: Secondary | ICD-10-CM | POA: Diagnosis not present

## 2024-07-03 DIAGNOSIS — Z23 Encounter for immunization: Secondary | ICD-10-CM | POA: Diagnosis not present

## 2024-07-03 LAB — BASIC METABOLIC PANEL WITH GFR
BUN/Creatinine Ratio: 9 (ref 9–20)
BUN: 10 mg/dL (ref 6–24)
CO2: 23 mmol/L (ref 20–29)
Calcium: 8.9 mg/dL (ref 8.7–10.2)
Chloride: 103 mmol/L (ref 96–106)
Creatinine, Ser: 1.07 mg/dL (ref 0.76–1.27)
Glucose: 92 mg/dL (ref 70–99)
Potassium: 3.4 mmol/L — ABNORMAL LOW (ref 3.5–5.2)
Sodium: 141 mmol/L (ref 134–144)
eGFR: 88 mL/min/1.73 (ref >59)

## 2024-07-03 LAB — LIPID PANEL
Chol/HDL Ratio: 3.2 ratio (ref 0.0–5.0)
Cholesterol, Total: 112 mg/dL (ref 100–199)
HDL: 35 mg/dL — ABNORMAL LOW (ref >39)
LDL Chol Calc (NIH): 63 mg/dL (ref 0–99)
Triglycerides: 67 mg/dL (ref 0–149)
VLDL Cholesterol Cal: 14 mg/dL (ref 5–40)

## 2024-07-03 LAB — MICROALBUMIN / CREATININE URINE RATIO
Creatinine, Urine: 329.9 mg/dL
Microalb/Creat Ratio: 11 mg/g{creat} (ref 0–29)
Microalbumin, Urine: 36.5 ug/mL

## 2024-07-03 LAB — MAGNESIUM: Magnesium: 1.8 mg/dL (ref 1.6–2.3)

## 2024-07-03 MED ORDER — SULFAMETHOXAZOLE-TRIMETHOPRIM 800-160 MG PO TABS
1.0000 | ORAL_TABLET | Freq: Every day | ORAL | 3 refills | Status: AC
Start: 2024-07-03 — End: ?

## 2024-07-03 MED ORDER — BIKTARVY 50-200-25 MG PO TABS: 1.0000 | ORAL_TABLET | Freq: Every day | ORAL | 3 refills | Status: AC

## 2024-07-03 NOTE — Progress Notes (Signed)
 [facility]     HPI: Mark Maddox is a 43 y.o. male presents for HIV management. Newly Cx at helath department, HIV 1+, on 10/16/22. 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 Pt works at [employer]. Pt has not been sexually active since July, 2023. Ex was drug addict. Pt has been tested for HIV in 2012 in negative  Today : Just went on a cruise, with friends. Not sexually active since [place]. New job   PMHx: DM now [place] Date of diagnosis: 2/25/'24 ART exposure: no Past Ois: no Risk factors: MSM,  Partners in last 2months 0, in the last 12 months 1.  Anal sex receptive y, insertivey . Contraception sometimes Oral sex, contraception no Vaginal penile sex, contraception no   Social: non drinker/does not smoke ciggerettes Occupation: Data Processing Manager for truck driving.  Housing: leaves in house/mom Support: Family aware of diagnosis Understanding of HIV: fair    Past Medical History:  Diagnosis Date   Asthma    had asthma as a child   Diabetes mellitus without complication (HCC)    Enlarged prostate    HIV disease (HCC)    Hypertension    hx of    Inguinal hernia bilateral, non-recurrent    Obesity     Past Surgical History:  Procedure Laterality Date   dental extractions     wisdom teeth   HERNIA REPAIR     INGUINAL HERNIA REPAIR  07/04/2012   Procedure: LAPAROSCOPIC BILATERAL INGUINAL HERNIA REPAIR;  Surgeon: Mark Leos, MD;  Location: [facility] OR;  Service: General;  Laterality: Bilateral;   INSERTION OF MESH  07/04/2012   Procedure: INSERTION OF MESH;  Surgeon: Mark Leos, MD;  Location: [facility] OR;  Service: General;  Laterality: Bilateral;    Family History  Problem Relation Age of Onset   Diabetes Maternal Grandmother    Heart disease Maternal Grandfather        heart attack   Diabetes Maternal Grandfather    Prostate cancer Paternal Grandfather    Liver cancer Neg Hx    Colon cancer Neg Hx     Esophageal cancer Neg Hx    Current Outpatient Medications on File Prior to Visit  Medication Sig Dispense Refill   amLODipine  (NORVASC ) 10 MG tablet Take 1 tablet (10 mg total) by mouth daily. 90 tablet 4   bictegravir-emtricitabine -tenofovir  AF (BIKTARVY ) 50-200-25 MG TABS tablet Take 1 tablet by mouth daily. Try to take at the same time each day with or without food. 30 tablet 5   hydrochlorothiazide  (HYDRODIURIL ) 25 MG tablet Take 1 tablet (25 mg total) by mouth daily. 90 tablet 4   sulfamethoxazole -trimethoprim  (BACTRIM  DS) 800-160 MG tablet Take 1 tablet by mouth daily. 30 tablet 5   tirzepatide  (MOUNJARO ) 10 MG/0.5ML Pen Inject 10 mg into the skin once a week. **Dose change 6 mL 4   No current facility-administered medications on file prior to visit.    No Known Allergies    Lab Results HIV 1 RNA Quant  Date Value  02/29/2024 31 copies/mL (H)  08/07/2023 85 Copies/mL (H)  04/26/2023 179 Copies/mL (H)   CD4 T Cell Abs (/uL)  Date Value  02/29/2024 291 (L)  12/21/2023 164 (L)  08/07/2023 191 (L)   No results found for: HIV1GENOSEQ Lab Results  Component Value Date   WBC 5.1 02/29/2024   HGB 15.6 02/29/2024   HCT 45.4 02/29/2024  MCV 89.0 02/29/2024   PLT 255 02/29/2024    Lab Results  Component Value Date   CREATININE 1.07 07/02/2024   BUN 10 07/02/2024   NA 141 07/02/2024   K 3.4 (L) 07/02/2024   CL 103 07/02/2024   CO2 23 07/02/2024   Lab Results  Component Value Date   ALT 20 02/29/2024   AST 20 02/29/2024   ALKPHOS 79 01/29/2024   BILITOT 0.6 02/29/2024    Lab Results  Component Value Date   CHOL 112 07/02/2024   TRIG 67 07/02/2024   HDL 35 (L) 07/02/2024   LDLCALC 63 07/02/2024   Lab Results  Component Value Date   HAV REACTIVE (A) 11/30/2022   Lab Results  Component Value Date   HEPBSAG NON-REACTIVE 11/30/2022   HEPBSAB NON-REACTIVE 11/30/2022   Lab Results  Component Value Date   HCVAB NEGATIVE 01/20/2017   Lab Results   Component Value Date   CHLAMYDIAWP Negative 12/21/2023   N Negative 12/21/2023   No results found for: GCPROBEAPT No results found for: QUANTGOLD  Assessment/Plan #HIV -CD4 291, VL31, on 02/2024 --on biktarvy  90 days, bactrim  90 day and bactime -Interested in injecitbles -Labs today, -Follow-up in 6 months        #Vaccination COVID x2, booster 11/30/22-today Flu 04/26/23-today Monkeypox not UTD PCV 20 6/4/204  Meningitis  12/21/23  HPV next time HepA immune HEpB 1st dose  12/14/22, 2nd dose ty 01/24/23 Tdap 11/24/2017 Shingles     #Dm-A1c 6.7 -Management per primary   #Health maintenance -Quantiferon 11/30/22 nr -RPR 12/21/23 -HCV 12/21/23 -GC urine 12/21/23 -Lipid  The ASCVD Risk score (Arnett DK, et al., 2019) failed to calculate for the following reasons:   The valid total cholesterol range is 130 to 320 mg/dL Cholesterol 887  -Dysplasia screen  M 11/30/22 ASCUS, HPV positive(high risk). Lost insurance-> seen by [facility] EGD done with GI. 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

## 2024-07-04 LAB — T-HELPER CELLS (CD4) COUNT (NOT AT ARMC)
CD4 % Helper T Cell: 19 % — ABNORMAL LOW (ref 33–65)
CD4 T Cell Abs: 278 /uL — ABNORMAL LOW (ref 400–1790)

## 2024-07-05 LAB — COMPLETE METABOLIC PANEL WITHOUT GFR
AG Ratio: 1.5 (calc) (ref 1.0–2.5)
ALT: 13 U/L (ref 9–46)
AST: 14 U/L (ref 10–40)
Albumin: 4.2 g/dL (ref 3.6–5.1)
Alkaline phosphatase (APISO): 54 U/L (ref 36–130)
BUN: 12 mg/dL (ref 7–25)
CO2: 29 mmol/L (ref 20–32)
Calcium: 9.7 mg/dL (ref 8.6–10.3)
Chloride: 104 mmol/L (ref 98–110)
Creat: 1.05 mg/dL (ref 0.60–1.29)
Globulin: 2.8 g/dL (ref 1.9–3.7)
Glucose, Bld: 86 mg/dL (ref 65–99)
Potassium: 3.9 mmol/L (ref 3.5–5.3)
Sodium: 140 mmol/L (ref 135–146)
Total Bilirubin: 0.3 mg/dL (ref 0.2–1.2)
Total Protein: 7 g/dL (ref 6.1–8.1)

## 2024-07-05 LAB — HIV-1 RNA QUANT-NO REFLEX-BLD
HIV 1 RNA Quant: 80 {copies}/mL — ABNORMAL HIGH
HIV-1 RNA Quant, Log: 1.9 {Log_copies}/mL — ABNORMAL HIGH

## 2024-07-05 LAB — CBC WITH DIFFERENTIAL/PLATELET
Absolute Lymphocytes: 1479 {cells}/uL (ref 850–3900)
Absolute Monocytes: 360 {cells}/uL (ref 200–950)
Basophils Absolute: 32 {cells}/uL (ref 0–200)
Basophils Relative: 0.6 %
Eosinophils Absolute: 392 {cells}/uL (ref 15–500)
Eosinophils Relative: 7.4 %
HCT: 44.7 % (ref 38.5–50.0)
Hemoglobin: 15.1 g/dL (ref 13.2–17.1)
MCH: 30 pg (ref 27.0–33.0)
MCHC: 33.8 g/dL (ref 32.0–36.0)
MCV: 88.7 fL (ref 80.0–100.0)
MPV: 8.7 fL (ref 7.5–12.5)
Monocytes Relative: 6.8 %
Neutro Abs: 3037 {cells}/uL (ref 1500–7800)
Neutrophils Relative %: 57.3 %
Platelets: 220 Thousand/uL (ref 140–400)
RBC: 5.04 Million/uL (ref 4.20–5.80)
RDW: 12.1 % (ref 11.0–15.0)
Total Lymphocyte: 27.9 %
WBC: 5.3 Thousand/uL (ref 3.8–10.8)

## 2024-09-26 ENCOUNTER — Ambulatory Visit: Admitting: Internal Medicine

## 2025-01-21 ENCOUNTER — Encounter: Admitting: Family Medicine
